# Patient Record
Sex: Male | Born: 1955 | Race: White | Hispanic: No | Marital: Single | Smoking: Never smoker
Health system: Southern US, Community
[De-identification: ages and names within clinical notes are randomized; demographics above are authoritative.]

## PROBLEM LIST (undated history)

## (undated) DIAGNOSIS — I429 Cardiomyopathy, unspecified: Secondary | ICD-10-CM

## (undated) DIAGNOSIS — I10 Essential (primary) hypertension: Secondary | ICD-10-CM

## (undated) DIAGNOSIS — M1990 Unspecified osteoarthritis, unspecified site: Secondary | ICD-10-CM

## (undated) DIAGNOSIS — E785 Hyperlipidemia, unspecified: Secondary | ICD-10-CM

## (undated) DIAGNOSIS — G4730 Sleep apnea, unspecified: Secondary | ICD-10-CM

## (undated) DIAGNOSIS — IMO0001 Reserved for inherently not codable concepts without codable children: Secondary | ICD-10-CM

## (undated) DIAGNOSIS — I38 Endocarditis, valve unspecified: Secondary | ICD-10-CM

## (undated) HISTORY — DX: Cardiomyopathy, unspecified: I42.9

## (undated) HISTORY — PX: OTHER SURGICAL HISTORY: SHX169

## (undated) HISTORY — DX: Hyperlipidemia, unspecified: E78.5

## (undated) HISTORY — DX: Endocarditis, valve unspecified: I38

## (undated) HISTORY — DX: Sleep apnea, unspecified: G47.30

---

## 2012-04-30 ENCOUNTER — Observation Stay: Payer: Self-pay | Admitting: Internal Medicine

## 2012-04-30 LAB — COMPREHENSIVE METABOLIC PANEL
Albumin: 3.8 g/dL (ref 3.4–5.0)
Alkaline Phosphatase: 82 U/L (ref 50–136)
BUN: 18 mg/dL (ref 7–18)
Bilirubin,Total: 0.4 mg/dL (ref 0.2–1.0)
Creatinine: 0.84 mg/dL (ref 0.60–1.30)
Glucose: 96 mg/dL (ref 65–99)
Osmolality: 277 (ref 275–301)
SGOT(AST): 27 U/L (ref 15–37)
Sodium: 138 mmol/L (ref 136–145)
Total Protein: 7.1 g/dL (ref 6.4–8.2)

## 2012-04-30 LAB — CBC
HCT: 42.2 % (ref 40.0–52.0)
MCHC: 34.7 g/dL (ref 32.0–36.0)
MCV: 91 fL (ref 80–100)
RBC: 4.63 10*6/uL (ref 4.40–5.90)
RDW: 13.2 % (ref 11.5–14.5)
WBC: 5.6 10*3/uL (ref 3.8–10.6)

## 2012-04-30 LAB — TROPONIN I: Troponin-I: <0.02 ng/mL

## 2012-04-30 LAB — CK TOTAL AND CKMB (NOT AT ARMC): CK, Total: 147 U/L (ref 35–232)

## 2012-05-01 LAB — LIPID PANEL
Cholesterol: 170 mg/dL (ref 0–200)
HDL Cholesterol: 58 mg/dL (ref 40–60)
Ldl Cholesterol, Calc: 90 mg/dL (ref 0–100)
Triglycerides: 111 mg/dL (ref 0–200)
VLDL Cholesterol, Calc: 22 mg/dL (ref 5–40)

## 2012-05-01 LAB — HEMOGLOBIN A1C: Hemoglobin A1C: 5 % (ref 4.2–6.3)

## 2014-03-12 DIAGNOSIS — E782 Mixed hyperlipidemia: Secondary | ICD-10-CM | POA: Insufficient documentation

## 2014-09-14 NOTE — Discharge Summary (Signed)
PATIENT NAME:  Gavin Barnes, Gavin Barnes MR#:  [id number] DATE OF BIRTH:  16-Jan-1956  DATE OF ADMISSION:  04/30/2012 DATE OF DISCHARGE:  05/01/2012  DISCHARGE DIAGNOSES:  1. Chest pain likely due to acute new onset congestive heart failure, now improved. Negative cardiac enzymes.  2. Lower extremity edema likely from congestive heart failure.  3. Acute systolic congestive heart failure, new onset, with echo showing ejection fraction of 35%, started on ACE inhibitor and low dose diuretic, pt in agreement. He is also in agreement to see Cardiology tomorrow but he did not want to stay in the hospital anymore.    SECONDARY DIAGNOSIS: Hypertension.   CONSULTATIONS: None.   PROCEDURES/RADIOLOGY:  1. Chest x-ray on December 4th showed no acute cardiopulmonary disease.  2. 2-D echocardiogram on December 5th showed mild to moderate global hypokinesis of left ventricle, EF of 35%, mild to moderate mitral regurgitation, mild to moderate tricuspid regurgitation.   HISTORY AND SHORT HOSPITAL COURSE: The patient is a 59 year old male with no significant medical problems who was admitted for chest pain. He was ruled out with three negative sets of cardiac enzymes. His chest pain was resolved although he had significant lower extremity edema for which 2-D echocardiogram was obtained which showed EF of 35% and significant regurgitation. The patient was really anxious to go home and did not want to stay in the hospital as he did not have anymore symptoms other than lower extremity edema. He was felt stable enough to be discharged home. His chest pain was resolved.  On the date of discharge, his vital signs were as follows: Temperature 97.6, heart rate 65 per minute, respirations 18 per minute, blood pressure 121/82 mmHg. He was saturating 97% on room air.   PERTINENT PHYSICAL EXAMINATION ON THE DATE OF DISCHARGE: CARDIOVASCULAR: S1, S2 normal. No murmurs, rubs, or gallop. LUNGS: Clear to auscultation bilaterally. No  wheezing, rales, rhonchi, or crepitation. ABDOMEN: Soft, benign. NEUROLOGIC: Nonfocal examination. LOWER EXTREMITIES: He had 2+ edema bilaterally. All other physical examination remained at the baseline.   DISCHARGE MEDICATIONS:  1. Lisinopril 2.5 mg p.o. daily.  2. Lasix 20 mg p.o. daily.  3. Stress 600 with zinc once daily.  4. Multivitamin once daily.    DISCHARGE DIET: Low sodium, low fat, low cholesterol.   DISCHARGE ACTIVITY: As tolerated.   DISCHARGE INSTRUCTIONS AND FOLLOW-UP:  1. The patient was instructed to follow-up with his new primary care physician, Dr. Benita Stabile, in 1 to 2 weeks.  2. He will need follow-up with Dr. Serafina Royals tomorrow morning on December 6th at 9 a.m. The patient was in agreement with the follow-up appointment.   CONDITION ON DISCHARGE: He is being discharged in stable condition.   TOTAL TIME DISCHARGING THIS PATIENT: 55 minutes.   ____________________________ Lucina Mellow. Manuella Ghazi, MD vss:drc D: 05/01/2012 22:35:18 ET T: 05/02/2012 11:25:42 ET JOB#: [id number]  cc: Zabdiel Dripps S. Manuella Ghazi, MD, <Dictator> Leona Carry. Hall Busing, MD Corey Skains, MD Lucina Mellow [facility] MD ELECTRONICALLY SIGNED 05/02/2012 14:29

## 2014-09-14 NOTE — H&P (Signed)
PATIENT NAME:  Gavin Barnes, Gavin Barnes MR#:  [id number] DATE OF BIRTH:  [birth year]  DATE OF ADMISSION:  04/30/2012  PRIMARY CARE PHYSICIAN: None.   CHIEF COMPLAINT: Chest pain.   HISTORY OF PRESENT ILLNESS: This is a 59 year old male with past medical history of hypertension and noncompliant with the medication and following with doctor as he didn't want to go to the primary care physician and he stopped going to him and is in search of new medical doctor, is not taking any medication for his hypertension currently. 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 He took a break and sat down for a while, had his lunch but the pain did not get relieved so after one hour he decided to go the nurse in the school. She checked the blood pressure, pressure was high at level of 160 so she told him to go to the Emergency Room or call 911 and patient preferred to come to the Emergency Room. He denies any cough, any fever, palpitations, syncopal episode, dizziness or similar pain in the past. He says that after coming to the Emergency Room and receiving some medications the pain is better now. It is 1/10 right now.   REVIEW OF SYSTEMS: GENERAL: Negative for fever, weight loss, weakness, EYES: No blurring of vision. No irritation or inflammation or discharge from the eyes. EARS: No hearing problem, ringing in the ears or discharge from the ears. RESPIRATORY: No shortness of breath, cough. CARDIOVASCULAR: Has chest pain but denies any palpitation, orthopnea, or edema on the limbs. GASTROINTESTINAL: Denies any nausea, vomiting, diarrhea, or change in bowel habits. GENITOURINARY: Denies any increased frequency, burning or change in his urinary habits. MUSCULOSKELETAL: Denies any joint swelling or pain. SKIN: Denies any rashes. NEUROLOGICAL: Denies any headache, numbness, tingling, weakness,  tremors. PSYCHIATRIC: Denies any insomnia, depression or psychiatric problems.   PAST MEDICAL HISTORY: Hypertension, noncompliant.   PAST SURGICAL HISTORY: None.   ALLERGIES: He has frequent allergic reactions, multiple tests are done to find allergen but they could not find any allergen. Denies any allergy to medication.   SOCIAL HISTORY:  Negative for smoking, drinking alcohol socially, and denies use of illegal drugs. He is a Education officer, museum in seventh grade.   FAMILY HISTORY: Positive for sudden cardiac death due to heart attack in brother at age of 61.    PHYSICAL EXAMINATION:  VITAL SIGNS: Temperature 97.3, pulse 93, blood pressure 182/92, oxygen saturation 99 on room air, respirations 18.   GENERAL: He does not appear in any acute distress, alert, oriented to time, place, and person and fully cooperative with history taking and physical examination.   HEENT: Head and neck grossly atraumatic. Conjunctivae pink. Oral mucosa moist. Hearing intact.   NECK: Supple. No JVD. No cervical lymphadenopathy appreciated.   RESPIRATORY: Bilateral clear and equal air entry.   CARDIOVASCULAR: S1, S2 present, regular. No murmur.   ABDOMEN: Soft, nontender. Bowel sounds present. No organomegaly appreciated.   SKIN: No rashes.   LYMPH: Bilateral leg pitting edema present.   NEUROLOGICAL: Grossly intact. No tremor. No sensory or motor deficit.   PSYCHIATRIC: Does not appear in any acute psychiatric illness.   LABORATORY, DIAGNOSTIC AND RADIOLOGICAL DATA: Glucose 96, BUN 18, creatinine 0.84, sodium 138, potassium 3.9, chloride 108, CO2 24, calcium 8.5, total protein 7.1, albumin 3.8, bilirubin 0.4, alkaline phosphatase 82, SGOT 27,  SGPT 32, CK total 147, troponin less than 0.02, WBC 5.6, hemoglobin 14.6, platelet count 253. Chest x-ray is not done yet. EKG left ventricular hypertrophy is evident by AVL, otherwise normal sinus rhythm.   ASSESSMENT: 59 year old male noncompliant with the  medication for hypertension and EKG finding of left ventricular hypertrophy having family history of cardiac death presented with chest pain radiating to left arm.  1. Unstable angina. Will keep him under observation on telemetry floor. Do serial troponin. Aspirin, metoprolol for blood pressure control and echocardiogram to check LV function.  2. Hypertension. Will give him metoprolol. 3. Deep vein thrombosis and GI prophylaxis.  4. CODE STATUS: FULL CODE.   TOTAL TIME SPENT: 55 minutes.   ____________________________ Ceasar Lund Anselm Jungling, MD vgv:cms D: 04/30/2012 21:02:44 ET T: 05/01/2012 06:17:52 ET JOB#: [id number]  cc: Ceasar Lund. Anselm Jungling, MD, <Dictator>  Vaughan Basta MD ELECTRONICALLY SIGNED 05/12/2012 22:55

## 2014-09-15 DIAGNOSIS — I10 Essential (primary) hypertension: Secondary | ICD-10-CM | POA: Insufficient documentation

## 2015-01-10 ENCOUNTER — Other Ambulatory Visit: Payer: Self-pay | Admitting: Otolaryngology

## 2015-01-10 ENCOUNTER — Encounter: Payer: Self-pay | Admitting: *Deleted

## 2015-01-10 DIAGNOSIS — K148 Other diseases of tongue: Secondary | ICD-10-CM

## 2015-01-10 DIAGNOSIS — R220 Localized swelling, mass and lump, head: Principal | ICD-10-CM

## 2015-01-12 ENCOUNTER — Ambulatory Visit
Admission: RE | Admit: 2015-01-12 | Discharge: 2015-01-12 | Disposition: A | Discharge status: Home / Self Care | Payer: BC Managed Care – PPO | Source: Ambulatory Visit | Attending: Otolaryngology | Admitting: Otolaryngology

## 2015-01-12 DIAGNOSIS — K148 Other diseases of tongue: Secondary | ICD-10-CM

## 2015-01-12 DIAGNOSIS — R220 Localized swelling, mass and lump, head: Secondary | ICD-10-CM | POA: Diagnosis present

## 2015-01-12 DIAGNOSIS — K149 Disease of tongue, unspecified: Secondary | ICD-10-CM | POA: Insufficient documentation

## 2015-01-12 DIAGNOSIS — M47892 Other spondylosis, cervical region: Secondary | ICD-10-CM | POA: Diagnosis not present

## 2015-01-12 DIAGNOSIS — R599 Enlarged lymph nodes, unspecified: Secondary | ICD-10-CM | POA: Diagnosis not present

## 2015-01-12 MED ORDER — IOHEXOL 300 MG/ML  SOLN
75.0000 mL | Freq: Once | INTRAMUSCULAR | Status: AC | PRN
  Administered 2015-01-12: 75 mL via INTRAVENOUS

## 2015-01-12 NOTE — Discharge Instructions (Signed)

## 2015-01-13 ENCOUNTER — Ambulatory Visit: Payer: BC Managed Care – PPO | Admitting: Anesthesiology

## 2015-01-13 ENCOUNTER — Ambulatory Visit
Admission: RE | Admit: 2015-01-13 | Discharge: 2015-01-13 | Disposition: A | Discharge status: Home / Self Care | Payer: BC Managed Care – PPO | Source: Ambulatory Visit | Attending: Otolaryngology | Admitting: Otolaryngology

## 2015-01-13 ENCOUNTER — Encounter: Payer: Self-pay | Admitting: *Deleted

## 2015-01-13 ENCOUNTER — Encounter
Admission: RE | Disposition: A | Discharge status: Home / Self Care | Payer: Self-pay | Source: Ambulatory Visit | Attending: Otolaryngology

## 2015-01-13 DIAGNOSIS — C029 Malignant neoplasm of tongue, unspecified: Secondary | ICD-10-CM | POA: Diagnosis not present

## 2015-01-13 DIAGNOSIS — Z79891 Long term (current) use of opiate analgesic: Secondary | ICD-10-CM | POA: Diagnosis not present

## 2015-01-13 DIAGNOSIS — Z823 Family history of stroke: Secondary | ICD-10-CM | POA: Insufficient documentation

## 2015-01-13 DIAGNOSIS — I10 Essential (primary) hypertension: Secondary | ICD-10-CM | POA: Diagnosis not present

## 2015-01-13 DIAGNOSIS — I2510 Atherosclerotic heart disease of native coronary artery without angina pectoris: Secondary | ICD-10-CM | POA: Diagnosis not present

## 2015-01-13 DIAGNOSIS — Z79899 Other long term (current) drug therapy: Secondary | ICD-10-CM | POA: Insufficient documentation

## 2015-01-13 DIAGNOSIS — Z8262 Family history of osteoporosis: Secondary | ICD-10-CM | POA: Insufficient documentation

## 2015-01-13 DIAGNOSIS — K1370 Unspecified lesions of oral mucosa: Secondary | ICD-10-CM | POA: Diagnosis present

## 2015-01-13 HISTORY — DX: Essential (primary) hypertension: I10

## 2015-01-13 HISTORY — PX: TONGUE BIOPSY: SHX6575

## 2015-01-13 HISTORY — PX: DIRECT LARYNGOSCOPY: SHX5326

## 2015-01-13 SURGERY — LARYNGOSCOPY, DIRECT
Anesthesia: General | Laterality: Right | Wound class: Clean Contaminated

## 2015-01-13 MED ORDER — LIDOCAINE HCL 4 % MT SOLN
OROMUCOSAL | Status: DC | PRN
  Administered 2015-01-13: 4 mL via TOPICAL

## 2015-01-13 MED ORDER — OXYCODONE HCL 5 MG/5ML PO SOLN
5.0000 mg | Freq: Once | ORAL | Status: DC | PRN
Start: 2015-01-13 — End: 2015-01-13

## 2015-01-13 MED ORDER — LIDOCAINE HCL (CARDIAC) 20 MG/ML IV SOLN
INTRAVENOUS | Status: DC | PRN
  Administered 2015-01-13: 50 mg via INTRAVENOUS

## 2015-01-13 MED ORDER — MIDAZOLAM HCL 5 MG/5ML IJ SOLN
INTRAMUSCULAR | Status: DC | PRN
  Administered 2015-01-13: 2 mg via INTRAVENOUS

## 2015-01-13 MED ORDER — ACETAMINOPHEN 325 MG PO TABS: 325.0000 mg | ORAL_TABLET | ORAL | Status: DC | PRN

## 2015-01-13 MED ORDER — ONDANSETRON HCL 4 MG/2ML IJ SOLN
INTRAMUSCULAR | Status: DC | PRN
  Administered 2015-01-13: 4 mg via INTRAVENOUS

## 2015-01-13 MED ORDER — PROPOFOL 10 MG/ML IV BOLUS
INTRAVENOUS | Status: DC | PRN
  Administered 2015-01-13: 150 mg via INTRAVENOUS
  Administered 2015-01-13: 40 mg via INTRAVENOUS

## 2015-01-13 MED ORDER — ACETAMINOPHEN 160 MG/5ML PO SOLN: 325.0000 mg | ORAL | Status: DC | PRN

## 2015-01-13 MED ORDER — SUCCINYLCHOLINE CHLORIDE 20 MG/ML IJ SOLN
INTRAMUSCULAR | Status: DC | PRN
  Administered 2015-01-13: 80 mg via INTRAVENOUS

## 2015-01-13 MED ORDER — SILVER NITRATE-POT NITRATE 75-25 % EX MISC
CUTANEOUS | Status: DC | PRN
  Administered 2015-01-13: 1

## 2015-01-13 MED ORDER — OXYCODONE HCL 5 MG PO TABS: 5.0000 mg | ORAL_TABLET | Freq: Once | ORAL | Status: DC | PRN

## 2015-01-13 MED ORDER — DEXAMETHASONE SODIUM PHOSPHATE 4 MG/ML IJ SOLN
INTRAMUSCULAR | Status: DC | PRN
  Administered 2015-01-13: 10 mg via INTRAVENOUS

## 2015-01-13 MED ORDER — FENTANYL CITRATE (PF) 100 MCG/2ML IJ SOLN
INTRAMUSCULAR | Status: DC | PRN
  Administered 2015-01-13 (×2): 25 ug via INTRAVENOUS
  Administered 2015-01-13: 50 ug via INTRAVENOUS

## 2015-01-13 MED ORDER — LACTATED RINGERS IV SOLN
INTRAVENOUS | Status: DC
  Administered 2015-01-13: 08:00:00 via INTRAVENOUS

## 2015-01-13 MED ORDER — ONDANSETRON HCL 4 MG/2ML IJ SOLN: 4.0000 mg | Freq: Once | INTRAMUSCULAR | Status: DC | PRN

## 2015-01-13 MED ORDER — GLYCOPYRROLATE 0.2 MG/ML IJ SOLN
INTRAMUSCULAR | Status: DC | PRN
  Administered 2015-01-13: .1 mg via INTRAVENOUS

## 2015-01-13 MED ORDER — FENTANYL CITRATE (PF) 100 MCG/2ML IJ SOLN
25.0000 ug | INTRAMUSCULAR | Status: DC | PRN
  Administered 2015-01-13 (×3): 25 ug via INTRAVENOUS

## 2015-01-13 SURGICAL SUPPLY — 40 items
BASIN GRAD PLASTIC 32OZ STRL (MISCELLANEOUS) IMPLANT
BLADE SURG 15 STRL LF DISP TIS (BLADE) IMPLANT
BLADE SURG 15 STRL SS (BLADE)
BLOCK BITE GUARD (MISCELLANEOUS) ×3 IMPLANT
CORD BIP STRL DISP 12FT (MISCELLANEOUS) IMPLANT
COVER MAYO STAND STRL (DRAPES) ×3 IMPLANT
COVER TABLE BACK 60X90 (DRAPES) ×3 IMPLANT
CUP MEDICINE 2OZ PLAST GRAD ST (MISCELLANEOUS) IMPLANT
DRAPE HEAD BAR (DRAPES) ×3 IMPLANT
DRAPE SHEET LG 3/4 BI-LAMINATE (DRAPES) ×3 IMPLANT
DRESSING TELFA 4X3 1S ST N-ADH (GAUZE/BANDAGES/DRESSINGS) ×3 IMPLANT
ELECT CAUTERY BLADE TIP 2.5 (TIP)
ELECT CAUTERY NEEDLE 2.0 MIC (NEEDLE) ×3 IMPLANT
ELECTRODE CAUTERY BLDE TIP 2.5 (TIP) IMPLANT
GAUZE SPONGE 4X4 12PLY STRL (GAUZE/BANDAGES/DRESSINGS) IMPLANT
GLOVE PI ULTRA LF STRL 7.5 (GLOVE) ×2 IMPLANT
GLOVE PI ULTRA NON LATEX 7.5 (GLOVE) ×4
LIQUID BAND (GAUZE/BANDAGES/DRESSINGS) IMPLANT
MARKER SKIN SURG W/RULER VIO (MISCELLANEOUS) IMPLANT
NEEDLE 18GX1X1/2 (RX/OR ONLY) (NEEDLE) IMPLANT
NEEDLE FILTER BLUNT 18X 1/2SAF (NEEDLE)
NEEDLE FILTER BLUNT 18X1 1/2 (NEEDLE) IMPLANT
NEEDLE HYPO 25GX1X1/2 BEV (NEEDLE) ×3 IMPLANT
NS IRRIG 500ML POUR BTL (IV SOLUTION) ×3 IMPLANT
PACK DRAPE NASAL/ENT (PACKS) ×3 IMPLANT
PAD GROUND ADULT SPLIT (MISCELLANEOUS) ×3 IMPLANT
PATTIES SURGICAL .5 X.5 (GAUZE/BANDAGES/DRESSINGS) ×3 IMPLANT
PENCIL ELECTRO HAND CTR (MISCELLANEOUS) ×3 IMPLANT
SOL PREP PVP 2OZ (MISCELLANEOUS) ×3
SOLUTION PREP PVP 2OZ (MISCELLANEOUS) ×1 IMPLANT
SPONGE XRAY 4X4 16PLY STRL (MISCELLANEOUS) ×3 IMPLANT
STRAP BODY AND KNEE 60X3 (MISCELLANEOUS) ×3 IMPLANT
SUCTION FRAZIER TIP 10 FR DISP (SUCTIONS) IMPLANT
SUT PROLENE 5 0 P 3 (SUTURE) ×3 IMPLANT
SUT VIC AB 4-0 RB1 27 (SUTURE) ×2
SUT VIC AB 4-0 RB1 27X BRD (SUTURE) ×1 IMPLANT
SYRINGE 10CC LL (SYRINGE) ×3 IMPLANT
TOWEL OR 17X26 4PK STRL BLUE (TOWEL DISPOSABLE) ×3 IMPLANT
TUBING CONN 6MMX3.1M (TUBING) ×2
TUBING SUCTION CONN 0.25 STRL (TUBING) ×1 IMPLANT

## 2015-01-13 NOTE — Anesthesia Procedure Notes (Signed)
Procedure Name: Intubation Date/Time: 01/13/2015 8:54 AM Performed by: Mayme Genta Pre-anesthesia Checklist: Patient identified, Emergency Drugs available, Suction available, Patient being monitored and Timeout performed Patient Re-evaluated:Patient Re-evaluated prior to inductionOxygen Delivery Method: Circle system utilized Preoxygenation: Pre-oxygenation with 100% oxygen Intubation Type: IV induction Ventilation: Mask ventilation without difficulty Laryngoscope Size: Glidescope and 4 Grade View: Grade III Tube type: MLT Tube size: 6.0 mm Number of attempts: 3 Placement Confirmation: ETT inserted through vocal cords under direct vision,  positive ETCO2 and breath sounds checked- equal and bilateral Tube secured with: Tape Dental Injury: Teeth and Oropharynx as per pre-operative assessment  Comments: DL x 1 by Barbra Sarks CRNA, unable to visiualize cords. DL x 1 by Dr Junious Dresser, unable to visualize cords. Glidescope x 1 with full view of cords. ETT passed with stylet. +/= BBS.

## 2015-01-13 NOTE — Anesthesia Postprocedure Evaluation (Signed)
  Anesthesia Post-op Note  Patient: Gavin Barnes  Procedure(s) Performed: Procedure(s): DIRECT LARYNGOSCOPY (Right) TONGUE BIOPSY LESION RIGHT (Right)  Anesthesia type:General ETT  Patient location: PACU  Post pain: Pain level controlled  Post assessment: Post-op Vital signs reviewed, Patient's Cardiovascular Status Stable, Respiratory Function Stable, Patent Airway and No signs of Nausea or vomiting  Post vital signs: Reviewed and stable  Last Vitals:  Filed Vitals:   01/13/15 1000  BP: 138/88  Pulse: 92  Temp:   Resp: 12    Level of consciousness: awake, alert  and patient cooperative  Complications: No apparent anesthesia complications

## 2015-01-13 NOTE — Op Note (Signed)
01/13/2015  9:20 AM    Gavin Barnes  [id number]   Pre-Op Dx:  Right tongue lesion with suspicion of cancer   Post-op Dx: Right tongue lesion, likely cancer.  Proc: Direct laryngoscopy, biopsy right tongue lesion   Surg:  Zia Najera H  Anes:  GOT  EBL:  5 mL  Comp:  None  Findings:  Ulcerative lesion right lateral tongue, induration and fullness extends to a total of 5 cm and across midline  Procedure: The patient was given general anesthesia by oral endotracheal intubation. He had an anterior larynx and was difficult to see, and a glide scope was used for visualizing the cords. There were no lesions noted on the cords and he was intubated using a #6 oral endotracheal tube. A Dedo laryngoscope was used for visualizing the hypopharynx and larynx. The tongue base was clear and the vallecula was clear as well. The epiglottis looked normal. The vocal cords were without lesions. The piriform sinuses were clear. The posterior pharynx and lateral pharyngeal walls were clear.  The right side of the tongue had lost of lesion that was about 2.5 cm in length but had firmness and induration extending at least a centimeter all the way around the outside of the lesion. That induration can be felt a centimeter across midline to make the lesion at least 2 cm deep, and the induration could be felt 2 cm behind the mass as well. Duration extended down the floor mouth towards the submandibular triangle. The induration extended at least 5 cm from anterior to posterior over 2 cm in depth across midline and into the floor mouth. The mass was much larger than what could be observed at the ulcerative area.  A large cup biting forcep was used to remove multiple pieces of lesion to send for permanent section. These were taken from the anterior and inferior border and some from deep in the wound. He was oozing from the biopsy sites which was controlled with silver nitrate cautery.  Patient tolerated procedure  well.  he was awakened taken to the recovery room in satisfactory condition. There were no operative complications.  Dispo:   To PACU and then to be discharged home  Plan:  To follow-up in the office on Tuesday afternoon to go over the path report and discuss a treatment plan  Gavin Barnes H  01/13/2015 9:20 AM

## 2015-01-13 NOTE — H&P (Signed)
  H&P has been reviewed and no changes necessary. To be downloaded later. 

## 2015-01-13 NOTE — Transfer of Care (Signed)
Immediate Anesthesia Transfer of Care Note  Patient: Gavin Barnes  Procedure(s) Performed: Procedure(s): DIRECT LARYNGOSCOPY (Right) TONGUE BIOPSY LESION RIGHT (Right)  Patient Location: PACU  Anesthesia Type: General ETT  Level of Consciousness: awake, alert  and patient cooperative  Airway and Oxygen Therapy: Patient Spontanous Breathing and Patient connected to supplemental oxygen  Post-op Assessment: Post-op Vital signs reviewed, Patient's Cardiovascular Status Stable, Respiratory Function Stable, Patent Airway and No signs of Nausea or vomiting  Post-op Vital Signs: Reviewed and stable  Complications: No apparent anesthesia complications

## 2015-01-13 NOTE — Anesthesia Preprocedure Evaluation (Signed)
Anesthesia Evaluation  Patient identified by MRN, date of birth, ID band  Reviewed: Allergy & Precautions, H&P , NPO status , Patient's Chart, lab work & pertinent test results  Airway Mallampati: III  TM Distance: >3 FB Neck ROM: full    Dental no notable dental hx.    Pulmonary    Pulmonary exam normal       Cardiovascular hypertension, Rhythm:regular Rate:Normal     Neuro/Psych    GI/Hepatic   Endo/Other    Renal/GU      Musculoskeletal   Abdominal   Peds  Hematology   Anesthesia Other Findings Large soft mass on right side of tongue.  Does not appear that it will cause difficulty with intubation.  Reproductive/Obstetrics                             Anesthesia Physical Anesthesia Plan  ASA: II  Anesthesia Plan: General ETT   Post-op Pain Management:    Induction:   Airway Management Planned:   Additional Equipment:   Intra-op Plan:   Post-operative Plan:   Informed Consent: I have reviewed the patients History and Physical, chart, labs and discussed the procedure including the risks, benefits and alternatives for the proposed anesthesia with the patient or authorized representative who has indicated his/her understanding and acceptance.     Plan Discussed with: CRNA  Anesthesia Plan Comments:         Anesthesia Quick Evaluation

## 2015-01-14 ENCOUNTER — Encounter: Payer: Self-pay | Admitting: Otolaryngology

## 2015-01-17 LAB — SURGICAL PATHOLOGY

## 2015-01-21 ENCOUNTER — Ambulatory Visit: Payer: BC Managed Care – PPO | Admitting: Oncology

## 2015-01-26 ENCOUNTER — Inpatient Hospital Stay: Payer: BC Managed Care – PPO | Attending: Oncology | Admitting: Oncology

## 2015-01-26 ENCOUNTER — Encounter: Payer: Self-pay | Admitting: Oncology

## 2015-01-26 VITALS — BP 118/80 | HR 86 | Temp 98.6°F | Resp 16 | Ht 65.55 in | Wt 171.5 lb

## 2015-01-26 DIAGNOSIS — E785 Hyperlipidemia, unspecified: Secondary | ICD-10-CM | POA: Diagnosis not present

## 2015-01-26 DIAGNOSIS — Z800 Family history of malignant neoplasm of digestive organs: Secondary | ICD-10-CM

## 2015-01-26 DIAGNOSIS — Z79899 Other long term (current) drug therapy: Secondary | ICD-10-CM | POA: Diagnosis not present

## 2015-01-26 DIAGNOSIS — C029 Malignant neoplasm of tongue, unspecified: Secondary | ICD-10-CM | POA: Diagnosis present

## 2015-01-26 DIAGNOSIS — I10 Essential (primary) hypertension: Secondary | ICD-10-CM | POA: Insufficient documentation

## 2015-01-30 DIAGNOSIS — C029 Malignant neoplasm of tongue, unspecified: Secondary | ICD-10-CM | POA: Insufficient documentation
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
## 2015-02-03 ENCOUNTER — Ambulatory Visit
Admission: RE | Admit: 2015-02-03 | Discharge: 2015-02-03 | Disposition: A | Discharge status: Home / Self Care | Payer: BC Managed Care – PPO | Source: Ambulatory Visit | Attending: Oncology | Admitting: Oncology

## 2015-02-03 DIAGNOSIS — C029 Malignant neoplasm of tongue, unspecified: Secondary | ICD-10-CM | POA: Diagnosis present

## 2015-02-03 DIAGNOSIS — I2510 Atherosclerotic heart disease of native coronary artery without angina pectoris: Secondary | ICD-10-CM | POA: Insufficient documentation

## 2015-02-03 LAB — GLUCOSE, CAPILLARY: Glucose-Capillary: 90 mg/dL (ref 65–99)

## 2015-02-03 MED ORDER — FLUDEOXYGLUCOSE F - 18 (FDG) INJECTION
13.0800 | Freq: Once | INTRAVENOUS | Status: DC | PRN
  Administered 2015-02-03: 13.08 via INTRAVENOUS
  Filled 2015-02-03: qty 13.08

## 2015-02-07 ENCOUNTER — Inpatient Hospital Stay: Payer: BC Managed Care – PPO | Attending: Oncology | Admitting: Oncology

## 2015-02-07 ENCOUNTER — Ambulatory Visit
Admission: RE | Admit: 2015-02-07 | Discharge: 2015-02-07 | Disposition: A | Discharge status: Home / Self Care | Payer: Self-pay | Source: Ambulatory Visit | Attending: Radiation Oncology | Admitting: Radiation Oncology

## 2015-02-07 ENCOUNTER — Encounter: Payer: Self-pay | Admitting: Radiation Oncology

## 2015-02-07 VITALS — BP 117/79 | HR 87 | Temp 98.4°F | Resp 18 | Wt 175.0 lb

## 2015-02-07 DIAGNOSIS — Z79899 Other long term (current) drug therapy: Secondary | ICD-10-CM | POA: Insufficient documentation

## 2015-02-07 DIAGNOSIS — C01 Malignant neoplasm of base of tongue: Secondary | ICD-10-CM

## 2015-02-07 DIAGNOSIS — Z510 Encounter for antineoplastic radiation therapy: Secondary | ICD-10-CM | POA: Insufficient documentation

## 2015-02-07 DIAGNOSIS — C029 Malignant neoplasm of tongue, unspecified: Secondary | ICD-10-CM | POA: Diagnosis not present

## 2015-02-07 DIAGNOSIS — E785 Hyperlipidemia, unspecified: Secondary | ICD-10-CM | POA: Insufficient documentation

## 2015-02-07 DIAGNOSIS — I38 Endocarditis, valve unspecified: Secondary | ICD-10-CM | POA: Insufficient documentation

## 2015-02-07 DIAGNOSIS — G893 Neoplasm related pain (acute) (chronic): Secondary | ICD-10-CM | POA: Diagnosis not present

## 2015-02-07 DIAGNOSIS — Z800 Family history of malignant neoplasm of digestive organs: Secondary | ICD-10-CM | POA: Insufficient documentation

## 2015-02-07 DIAGNOSIS — I429 Cardiomyopathy, unspecified: Secondary | ICD-10-CM | POA: Insufficient documentation

## 2015-02-07 DIAGNOSIS — C021 Malignant neoplasm of border of tongue: Secondary | ICD-10-CM | POA: Insufficient documentation

## 2015-02-07 DIAGNOSIS — I10 Essential (primary) hypertension: Secondary | ICD-10-CM | POA: Diagnosis not present

## 2015-02-07 DIAGNOSIS — C770 Secondary and unspecified malignant neoplasm of lymph nodes of head, face and neck: Secondary | ICD-10-CM | POA: Insufficient documentation

## 2015-02-07 DIAGNOSIS — G4730 Sleep apnea, unspecified: Secondary | ICD-10-CM | POA: Insufficient documentation
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
## 2015-02-09 ENCOUNTER — Ambulatory Visit: Payer: Self-pay

## 2015-02-09 ENCOUNTER — Telehealth: Payer: Self-pay | Admitting: *Deleted

## 2015-02-09 ENCOUNTER — Ambulatory Visit
Admission: RE | Admit: 2015-02-09 | Discharge: 2015-02-09 | Disposition: A | Discharge status: Home / Self Care | Payer: Self-pay | Source: Ambulatory Visit | Attending: Radiation Oncology | Admitting: Radiation Oncology

## 2015-02-09 NOTE — Telephone Encounter (Signed)
Notified patient of Simulation appointment cancellation here at the [facility], but discussed with him going to Dr. Silvano Rusk office for the dental work.  Also told patient that this office would be calling him with another appointment scheduled for a post extraction follow up and a new simulation appointment.  Patient  Verbalized understanding, and stated that he would go to the dentist office at 2pm today.
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
## 2015-02-15 ENCOUNTER — Telehealth: Payer: Self-pay

## 2015-02-15 NOTE — Telephone Encounter (Signed)
Contacted patient to inform him of upcoming appts for XRT and chemotherapy.  When I told him the lab/MD/treatment was going to be in the mornings he became frustrated and said he was told he could come in the afternoon for treatments that would last 15-20 minutes.  I explained he was referring to the XRT but chemo treatments are longer and need to be schedule earlier in the day he said that will not work for him with his schedule.  When I asked what day would be better he said Saturday.  He then told me he can't talk right now because he is at work and his assistant is beside him.  I asked him to call the office back when he could so we could try to schedule his treatments.

## 2015-02-15 NOTE — Telephone Encounter (Signed)
Ok, thank you.  Please let me know if he is willing to take chemo at all.

## 2015-02-17 ENCOUNTER — Other Ambulatory Visit: Payer: BC Managed Care – PPO

## 2015-02-17 ENCOUNTER — Ambulatory Visit: Payer: BC Managed Care – PPO

## 2015-02-17 ENCOUNTER — Ambulatory Visit: Payer: BC Managed Care – PPO | Admitting: Oncology

## 2015-02-21 ENCOUNTER — Ambulatory Visit: Payer: Self-pay

## 2015-02-22 ENCOUNTER — Ambulatory Visit: Payer: Self-pay

## 2015-02-23 ENCOUNTER — Ambulatory Visit: Payer: Self-pay

## 2015-02-23 NOTE — Patient Instructions (Signed)
Cetuximab injection What is this medicine? CETUXIMAB (se TUX i mab) is a chemotherapy drug. It targets a specific protein within cancer cells and stops the cells from growing. It is used to treat colorectal cancer and head and neck cancer. This medicine may be used for other purposes; ask your health care provider or pharmacist if you have questions. COMMON BRAND NAME(S): Erbitux What should I tell my health care provider before I take this medicine? They need to know if you have any of these conditions: -heart disease -history of irregular heartbeat -history of low levels of calcium, magnesium, or potassium in the blood -lung or breathing disease, like asthma -an unusual or allergic reaction to cetuximab, other medicines, foods, dyes, or preservatives -pregnant or trying to get pregnant -breast-feeding How should I use this medicine? This drug is given as an infusion into a vein. It is administered in a hospital or clinic by a specially trained health care professional. Talk to your pediatrician regarding the use of this medicine in children. Special care may be needed. Overdosage: If you think you have taken too much of this medicine contact a poison control center or emergency room at once. NOTE: This medicine is only for you. Do not share this medicine with others. What if I miss a dose? It is important not to miss your dose. Call your doctor or health care professional if you are unable to keep an appointment. What may interact with this medicine? Interactions are not expected. This list may not describe all possible interactions. Give your health care provider a list of all the medicines, herbs, non-prescription drugs, or dietary supplements you use. Also tell them if you smoke, drink alcohol, or use illegal drugs. Some items may interact with your medicine. What should I watch for while using this medicine? Visit your doctor or health care professional for regular checks on your  progress. This drug may make you feel generally unwell. This is not uncommon, as chemotherapy can affect healthy cells as well as cancer cells. Report any side effects. Continue your course of treatment even though you feel ill unless your doctor tells you to stop. This medicine can make you more sensitive to the sun. Keep out of the sun while taking this medicine and for 2 months after the last dose. If you cannot avoid being in the sun, wear protective clothing and use sunscreen. Do not use sun lamps or tanning beds/booths. You may need blood work done while you are taking this medicine. In some cases, you may be given additional medicines to help with side effects. Follow all directions for their use. Call your doctor or health care professional for advice if you get a fever, chills or sore throat, or other symptoms of a cold or flu. Do not treat yourself. This drug decreases your body's ability to fight infections. Try to avoid being around people who are sick. Avoid taking products that contain aspirin, acetaminophen, ibuprofen, naproxen, or ketoprofen unless instructed by your doctor. These medicines may hide a fever. Do not become pregnant while taking this medicine. Women should inform their doctor if they wish to become pregnant or think they might be pregnant. There is a potential for serious side effects to an unborn child. Use adequate birth control methods. Avoid pregnancy for at least 6 months after your last dose. Talk to your health care professional or pharmacist for more information. Do not breast-feed an infant while taking this medicine or during the 2 months after your last   dose. What side effects may I notice from receiving this medicine? Side effects that you should report to your doctor or health care professional as soon as possible: -allergic reactions like skin rash, itching or hives, swelling of the face, lips, or tongue -breathing problems -changes in vision -fast, irregular  heartbeat -feeling faint or lightheaded, falls -fever, chills -mouth sores -redness, blistering, peeling or loosening of the skin, including inside the mouth -trouble passing urine or change in the amount of urine -unusually weak or tired Side effects that usually do not require medical attention (report to your doctor or health care professional if they continue or are bothersome): -changes in skin like acne, cracks, skin dryness -constipation -diarrhea -headache -nail changes -nausea, vomiting -stomach upset -weight loss This list may not describe all possible side effects. Call your doctor for medical advice about side effects. You may report side effects to FDA at 1-800-FDA-1088. Where should I keep my medicine? This drug is given in a hospital or clinic and will not be stored at home. NOTE: This sheet is a summary. It may not cover all possible information. If you have questions about this medicine, talk to your doctor, pharmacist, or health care provider.  2015, Elsevier/Gold Standard. (2013-08-26 16:14:34)  

## 2015-02-24 ENCOUNTER — Inpatient Hospital Stay: Payer: BC Managed Care – PPO

## 2015-02-24 ENCOUNTER — Ambulatory Visit: Payer: BC Managed Care – PPO | Admitting: Oncology

## 2015-02-24 ENCOUNTER — Ambulatory Visit: Payer: BC Managed Care – PPO

## 2015-02-24 ENCOUNTER — Ambulatory Visit: Payer: Self-pay

## 2015-02-24 ENCOUNTER — Inpatient Hospital Stay: Payer: BC Managed Care – PPO | Admitting: Oncology

## 2015-02-24 ENCOUNTER — Other Ambulatory Visit: Payer: BC Managed Care – PPO

## 2015-02-25 ENCOUNTER — Ambulatory Visit: Payer: Self-pay

## 2015-02-28 ENCOUNTER — Inpatient Hospital Stay: Payer: Self-pay

## 2015-02-28 ENCOUNTER — Ambulatory Visit: Payer: Self-pay

## 2015-02-28 ENCOUNTER — Inpatient Hospital Stay (HOSPITAL_BASED_OUTPATIENT_CLINIC_OR_DEPARTMENT_OTHER): Payer: BC Managed Care – PPO | Admitting: Oncology

## 2015-02-28 ENCOUNTER — Inpatient Hospital Stay: Payer: Self-pay | Attending: Oncology

## 2015-02-28 VITALS — BP 121/80 | HR 80 | Resp 18

## 2015-02-28 VITALS — BP 133/84 | HR 84 | Temp 97.0°F | Wt 165.0 lb

## 2015-02-28 DIAGNOSIS — C029 Malignant neoplasm of tongue, unspecified: Secondary | ICD-10-CM

## 2015-02-28 DIAGNOSIS — E785 Hyperlipidemia, unspecified: Secondary | ICD-10-CM

## 2015-02-28 DIAGNOSIS — I10 Essential (primary) hypertension: Secondary | ICD-10-CM

## 2015-02-28 DIAGNOSIS — F418 Other specified anxiety disorders: Secondary | ICD-10-CM

## 2015-02-28 DIAGNOSIS — G893 Neoplasm related pain (acute) (chronic): Secondary | ICD-10-CM

## 2015-02-28 DIAGNOSIS — C770 Secondary and unspecified malignant neoplasm of lymph nodes of head, face and neck: Secondary | ICD-10-CM

## 2015-02-28 DIAGNOSIS — L270 Generalized skin eruption due to drugs and medicaments taken internally: Secondary | ICD-10-CM | POA: Insufficient documentation

## 2015-02-28 DIAGNOSIS — G43909 Migraine, unspecified, not intractable, without status migrainosus: Secondary | ICD-10-CM | POA: Insufficient documentation

## 2015-02-28 DIAGNOSIS — Z79899 Other long term (current) drug therapy: Secondary | ICD-10-CM

## 2015-02-28 DIAGNOSIS — G4730 Sleep apnea, unspecified: Secondary | ICD-10-CM | POA: Insufficient documentation

## 2015-02-28 DIAGNOSIS — Z5112 Encounter for antineoplastic immunotherapy: Secondary | ICD-10-CM | POA: Insufficient documentation

## 2015-02-28 DIAGNOSIS — T451X5S Adverse effect of antineoplastic and immunosuppressive drugs, sequela: Secondary | ICD-10-CM | POA: Insufficient documentation

## 2015-02-28 DIAGNOSIS — I429 Cardiomyopathy, unspecified: Secondary | ICD-10-CM | POA: Insufficient documentation

## 2015-02-28 DIAGNOSIS — G4709 Other insomnia: Secondary | ICD-10-CM | POA: Insufficient documentation

## 2015-02-28 LAB — CBC WITH DIFFERENTIAL/PLATELET
BASOS ABS: 0 10*3/uL (ref 0–0.1)
Basophils Relative: 1 %
Eosinophils Absolute: 0 10*3/uL (ref 0–0.7)
Eosinophils Relative: 1 %
HEMATOCRIT: 41.9 % (ref 40.0–52.0)
Hemoglobin: 14.5 g/dL (ref 13.0–18.0)
LYMPHS PCT: 17 %
Lymphs Abs: 1 10*3/uL (ref 1.0–3.6)
MCH: 31.5 pg (ref 26.0–34.0)
MCHC: 34.6 g/dL (ref 32.0–36.0)
MCV: 91.1 fL (ref 80.0–100.0)
MONO ABS: 0.7 10*3/uL (ref 0.2–1.0)
Monocytes Relative: 12 %
NEUTROS ABS: 4 10*3/uL (ref 1.4–6.5)
Neutrophils Relative %: 69 %
Platelets: 284 10*3/uL (ref 150–440)
RBC: 4.6 MIL/uL (ref 4.40–5.90)
RDW: 12.7 % (ref 11.5–14.5)
WBC: 5.7 10*3/uL (ref 3.8–10.6)

## 2015-02-28 LAB — COMPREHENSIVE METABOLIC PANEL
ALT: 18 U/L (ref 17–63)
AST: 19 U/L (ref 15–41)
Albumin: 4.1 g/dL (ref 3.5–5.0)
Alkaline Phosphatase: 55 U/L (ref 38–126)
Anion gap: 5 (ref 5–15)
BILIRUBIN TOTAL: 0.6 mg/dL (ref 0.3–1.2)
BUN: 10 mg/dL (ref 6–20)
CALCIUM: 8.4 mg/dL — AB (ref 8.9–10.3)
CO2: 28 mmol/L (ref 22–32)
CREATININE: 0.91 mg/dL (ref 0.61–1.24)
Chloride: 101 mmol/L (ref 101–111)
GFR calc Af Amer: >60 mL/min (ref >60)
Glucose, Bld: 93 mg/dL (ref 65–99)
Potassium: 4.1 mmol/L (ref 3.5–5.1)
Sodium: 134 mmol/L — ABNORMAL LOW (ref 135–145)
TOTAL PROTEIN: 6.9 g/dL (ref 6.5–8.1)

## 2015-02-28 MED ORDER — SODIUM CHLORIDE 0.9 % IV SOLN
Freq: Once | INTRAVENOUS | Status: AC
  Administered 2015-02-28: 12:00:00 via INTRAVENOUS
  Filled 2015-02-28: qty 1000

## 2015-02-28 MED ORDER — CETUXIMAB CHEMO IV INJECTION 200 MG/100ML
400.0000 mg/m2 | Freq: Once | INTRAVENOUS | Status: AC
  Administered 2015-02-28: 800 mg via INTRAVENOUS
  Filled 2015-02-28: qty 400

## 2015-02-28 MED ORDER — DIPHENHYDRAMINE HCL 50 MG/ML IJ SOLN
25.0000 mg | Freq: Once | INTRAMUSCULAR | Status: AC
  Administered 2015-02-28: 25 mg via INTRAVENOUS
  Filled 2015-02-28: qty 1

## 2015-02-28 MED ORDER — LORAZEPAM 2 MG/ML IJ SOLN
0.5000 mg | Freq: Once | INTRAMUSCULAR | Status: AC
Start: 2015-02-28 — End: 2015-02-28
  Administered 2015-02-28: 0.5 mg via INTRAVENOUS
  Filled 2015-02-28: qty 1

## 2015-03-01 ENCOUNTER — Other Ambulatory Visit: Payer: Self-pay | Admitting: *Deleted

## 2015-03-01 ENCOUNTER — Ambulatory Visit: Payer: Self-pay

## 2015-03-01 ENCOUNTER — Ambulatory Visit
Admission: RE | Admit: 2015-03-01 | Discharge: 2015-03-01 | Disposition: A | Discharge status: Home / Self Care | Payer: Self-pay | Source: Ambulatory Visit | Attending: Radiation Oncology | Admitting: Radiation Oncology

## 2015-03-01 MED ORDER — FLUCONAZOLE 100 MG PO TABS: 100.0000 mg | ORAL_TABLET | Freq: Every day | ORAL | Status: DC

## 2015-03-02 ENCOUNTER — Ambulatory Visit: Payer: Self-pay

## 2015-03-03 ENCOUNTER — Ambulatory Visit: Payer: Self-pay

## 2015-03-04 ENCOUNTER — Ambulatory Visit: Payer: Self-pay

## 2015-03-07 ENCOUNTER — Other Ambulatory Visit: Payer: Self-pay | Admitting: *Deleted

## 2015-03-07 ENCOUNTER — Ambulatory Visit: Payer: Self-pay

## 2015-03-07 ENCOUNTER — Inpatient Hospital Stay (HOSPITAL_BASED_OUTPATIENT_CLINIC_OR_DEPARTMENT_OTHER): Payer: BC Managed Care – PPO | Admitting: Oncology

## 2015-03-07 ENCOUNTER — Inpatient Hospital Stay: Payer: Self-pay

## 2015-03-07 VITALS — BP 118/83 | HR 75 | Temp 97.5°F | Resp 16 | Wt 161.8 lb

## 2015-03-07 DIAGNOSIS — E785 Hyperlipidemia, unspecified: Secondary | ICD-10-CM

## 2015-03-07 DIAGNOSIS — C029 Malignant neoplasm of tongue, unspecified: Secondary | ICD-10-CM

## 2015-03-07 DIAGNOSIS — Z79899 Other long term (current) drug therapy: Secondary | ICD-10-CM

## 2015-03-07 DIAGNOSIS — I429 Cardiomyopathy, unspecified: Secondary | ICD-10-CM

## 2015-03-07 DIAGNOSIS — G893 Neoplasm related pain (acute) (chronic): Secondary | ICD-10-CM

## 2015-03-07 DIAGNOSIS — G4709 Other insomnia: Secondary | ICD-10-CM

## 2015-03-07 DIAGNOSIS — F418 Other specified anxiety disorders: Secondary | ICD-10-CM

## 2015-03-07 DIAGNOSIS — I10 Essential (primary) hypertension: Secondary | ICD-10-CM

## 2015-03-07 DIAGNOSIS — C770 Secondary and unspecified malignant neoplasm of lymph nodes of head, face and neck: Secondary | ICD-10-CM

## 2015-03-07 DIAGNOSIS — G4730 Sleep apnea, unspecified: Secondary | ICD-10-CM

## 2015-03-07 DIAGNOSIS — G43909 Migraine, unspecified, not intractable, without status migrainosus: Secondary | ICD-10-CM

## 2015-03-07 LAB — CBC WITH DIFFERENTIAL/PLATELET
Basophils Absolute: 0 10*3/uL (ref 0–0.1)
Basophils Relative: 1 %
EOS PCT: 3 %
Eosinophils Absolute: 0.1 10*3/uL (ref 0–0.7)
HCT: 41.4 % (ref 40.0–52.0)
Hemoglobin: 14.4 g/dL (ref 13.0–18.0)
LYMPHS ABS: 1.1 10*3/uL (ref 1.0–3.6)
LYMPHS PCT: 24 %
MCH: 31.4 pg (ref 26.0–34.0)
MCHC: 34.8 g/dL (ref 32.0–36.0)
MCV: 90.2 fL (ref 80.0–100.0)
MONO ABS: 0.7 10*3/uL (ref 0.2–1.0)
MONOS PCT: 14 %
Neutro Abs: 2.7 10*3/uL (ref 1.4–6.5)
Neutrophils Relative %: 58 %
PLATELETS: 292 10*3/uL (ref 150–440)
RBC: 4.59 MIL/uL (ref 4.40–5.90)
RDW: 12.6 % (ref 11.5–14.5)
WBC: 4.6 10*3/uL (ref 3.8–10.6)

## 2015-03-07 LAB — COMPREHENSIVE METABOLIC PANEL
ALBUMIN: 3.7 g/dL (ref 3.5–5.0)
ALT: 48 U/L (ref 17–63)
AST: 32 U/L (ref 15–41)
Alkaline Phosphatase: 58 U/L (ref 38–126)
Anion gap: 6 (ref 5–15)
BILIRUBIN TOTAL: 0.3 mg/dL (ref 0.3–1.2)
BUN: 11 mg/dL (ref 6–20)
CHLORIDE: 103 mmol/L (ref 101–111)
CO2: 28 mmol/L (ref 22–32)
Calcium: 9 mg/dL (ref 8.9–10.3)
Creatinine, Ser: 0.83 mg/dL (ref 0.61–1.24)
GFR calc Af Amer: >60 mL/min (ref >60)
GFR calc non Af Amer: >60 mL/min (ref >60)
GLUCOSE: 86 mg/dL (ref 65–99)
POTASSIUM: 3.8 mmol/L (ref 3.5–5.1)
Sodium: 137 mmol/L (ref 135–145)
Total Protein: 6.5 g/dL (ref 6.5–8.1)

## 2015-03-07 LAB — MAGNESIUM: Magnesium: 2.2 mg/dL (ref 1.7–2.4)

## 2015-03-07 MED ORDER — SODIUM CHLORIDE 0.9 % IV SOLN
Freq: Once | INTRAVENOUS | Status: AC
  Administered 2015-03-07: 10:00:00 via INTRAVENOUS
  Filled 2015-03-07: qty 1000

## 2015-03-07 MED ORDER — DIPHENHYDRAMINE HCL 50 MG/ML IJ SOLN
25.0000 mg | Freq: Once | INTRAMUSCULAR | Status: AC
  Administered 2015-03-07: 25 mg via INTRAVENOUS
  Filled 2015-03-07: qty 1

## 2015-03-07 MED ORDER — CETUXIMAB CHEMO IV INJECTION 200 MG/100ML
250.0000 mg/m2 | Freq: Once | INTRAVENOUS | Status: AC
  Administered 2015-03-07: 500 mg via INTRAVENOUS
  Filled 2015-03-07: qty 200

## 2015-03-07 MED ORDER — HYDROCODONE-ACETAMINOPHEN 5-325 MG PO TABS: 1.0000 | ORAL_TABLET | Freq: Four times a day (QID) | ORAL | Status: DC | PRN

## 2015-03-07 NOTE — Progress Notes (Signed)
After his first treatment patient had a migraine that lasted 3 hours.  He has not gotten all of the dental work required for him to start the XRT and during his chemo class he was instructed not have any dental work done.  He has an appointment with dentist this Wednesday and is not sure weather to keep it or cancel.  Also is having trouble sleeping at night.  Since his last visit patient has resigned from his current job to have more time and energy to focus on his medical care.  I have spoken with Elease Etienne, social worker, he has an appointment with Barnabas Lister on 03/08/15 @ 10:00 and the patient confirmed that this time will work with his schedule.
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
## 2015-03-08 ENCOUNTER — Ambulatory Visit: Payer: Self-pay

## 2015-03-08 NOTE — Progress Notes (Unsigned)
PSN met with patient today to discuss his financial concerns.  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  Patient lives alone and currently has no income.  PSN gave patient information about applying for Social Security benefits, and unemployment.  PSN will assist patient with resources from the [employer] [facility] [employer]

## 2015-03-09 ENCOUNTER — Ambulatory Visit: Payer: Self-pay

## 2015-03-10 ENCOUNTER — Ambulatory Visit
Admission: RE | Admit: 2015-03-10 | Discharge: 2015-03-10 | Disposition: A | Discharge status: Home / Self Care | Payer: Self-pay | Source: Ambulatory Visit | Attending: Radiation Oncology | Admitting: Radiation Oncology

## 2015-03-10 ENCOUNTER — Ambulatory Visit: Payer: Self-pay

## 2015-03-11 ENCOUNTER — Ambulatory Visit: Payer: Self-pay

## 2015-03-14 ENCOUNTER — Ambulatory Visit
Admission: RE | Admit: 2015-03-14 | Discharge: 2015-03-14 | Disposition: A | Discharge status: Home / Self Care | Payer: Self-pay | Source: Ambulatory Visit | Attending: Radiation Oncology | Admitting: Radiation Oncology

## 2015-03-14 ENCOUNTER — Ambulatory Visit: Payer: Self-pay

## 2015-03-14 ENCOUNTER — Inpatient Hospital Stay: Payer: Self-pay

## 2015-03-14 ENCOUNTER — Inpatient Hospital Stay (HOSPITAL_BASED_OUTPATIENT_CLINIC_OR_DEPARTMENT_OTHER): Payer: Self-pay | Admitting: Oncology

## 2015-03-14 VITALS — BP 115/73 | HR 65 | Temp 96.1°F | Resp 16 | Wt 167.3 lb

## 2015-03-14 DIAGNOSIS — C029 Malignant neoplasm of tongue, unspecified: Secondary | ICD-10-CM

## 2015-03-14 DIAGNOSIS — T451X5D Adverse effect of antineoplastic and immunosuppressive drugs, subsequent encounter: Secondary | ICD-10-CM

## 2015-03-14 DIAGNOSIS — G4730 Sleep apnea, unspecified: Secondary | ICD-10-CM

## 2015-03-14 DIAGNOSIS — F418 Other specified anxiety disorders: Secondary | ICD-10-CM

## 2015-03-14 DIAGNOSIS — Z79899 Other long term (current) drug therapy: Secondary | ICD-10-CM

## 2015-03-14 DIAGNOSIS — E785 Hyperlipidemia, unspecified: Secondary | ICD-10-CM

## 2015-03-14 DIAGNOSIS — C770 Secondary and unspecified malignant neoplasm of lymph nodes of head, face and neck: Secondary | ICD-10-CM

## 2015-03-14 DIAGNOSIS — I10 Essential (primary) hypertension: Secondary | ICD-10-CM

## 2015-03-14 DIAGNOSIS — L270 Generalized skin eruption due to drugs and medicaments taken internally: Secondary | ICD-10-CM

## 2015-03-14 DIAGNOSIS — I429 Cardiomyopathy, unspecified: Secondary | ICD-10-CM

## 2015-03-14 DIAGNOSIS — G4709 Other insomnia: Secondary | ICD-10-CM

## 2015-03-14 DIAGNOSIS — G893 Neoplasm related pain (acute) (chronic): Secondary | ICD-10-CM

## 2015-03-14 LAB — CBC WITH DIFFERENTIAL/PLATELET
BASOS ABS: 0 10*3/uL (ref 0–0.1)
BASOS PCT: 1 %
Eosinophils Absolute: 0.1 10*3/uL (ref 0–0.7)
Eosinophils Relative: 2 %
HEMATOCRIT: 42.7 % (ref 40.0–52.0)
HEMOGLOBIN: 14.7 g/dL (ref 13.0–18.0)
LYMPHS PCT: 15 %
Lymphs Abs: 0.8 10*3/uL — ABNORMAL LOW (ref 1.0–3.6)
MCH: 31.1 pg (ref 26.0–34.0)
MCHC: 34.5 g/dL (ref 32.0–36.0)
MCV: 90.2 fL (ref 80.0–100.0)
Monocytes Absolute: 0.6 10*3/uL (ref 0.2–1.0)
Monocytes Relative: 13 %
NEUTROS ABS: 3.6 10*3/uL (ref 1.4–6.5)
NEUTROS PCT: 69 %
Platelets: 248 10*3/uL (ref 150–440)
RBC: 4.74 MIL/uL (ref 4.40–5.90)
RDW: 12.7 % (ref 11.5–14.5)
WBC: 5.1 10*3/uL (ref 3.8–10.6)

## 2015-03-14 LAB — COMPREHENSIVE METABOLIC PANEL
ALBUMIN: 3.7 g/dL (ref 3.5–5.0)
ALK PHOS: 63 U/L (ref 38–126)
ALT: 54 U/L (ref 17–63)
ANION GAP: 5 (ref 5–15)
AST: 41 U/L (ref 15–41)
BUN: 13 mg/dL (ref 6–20)
CALCIUM: 8.7 mg/dL — AB (ref 8.9–10.3)
CO2: 29 mmol/L (ref 22–32)
CREATININE: 0.73 mg/dL (ref 0.61–1.24)
Chloride: 102 mmol/L (ref 101–111)
GFR calc Af Amer: >60 mL/min (ref >60)
GFR calc non Af Amer: >60 mL/min (ref >60)
GLUCOSE: 80 mg/dL (ref 65–99)
Potassium: 4.4 mmol/L (ref 3.5–5.1)
SODIUM: 136 mmol/L (ref 135–145)
Total Bilirubin: 0.4 mg/dL (ref 0.3–1.2)
Total Protein: 6.2 g/dL — ABNORMAL LOW (ref 6.5–8.1)

## 2015-03-14 LAB — MAGNESIUM: Magnesium: 2.2 mg/dL (ref 1.7–2.4)

## 2015-03-14 MED ORDER — DIPHENHYDRAMINE HCL 50 MG/ML IJ SOLN
25.0000 mg | Freq: Once | INTRAMUSCULAR | Status: AC
  Administered 2015-03-14: 25 mg via INTRAVENOUS
  Filled 2015-03-14: qty 1

## 2015-03-14 MED ORDER — DOXYCYCLINE HYCLATE 100 MG PO TABS: 100.0000 mg | ORAL_TABLET | Freq: Two times a day (BID) | ORAL | Status: DC

## 2015-03-14 MED ORDER — SODIUM CHLORIDE 0.9 % IV SOLN
Freq: Once | INTRAVENOUS | Status: AC
  Administered 2015-03-14: 11:00:00 via INTRAVENOUS
  Filled 2015-03-14: qty 1000

## 2015-03-14 MED ORDER — CETUXIMAB CHEMO IV INJECTION 200 MG/100ML
250.0000 mg/m2 | Freq: Once | INTRAVENOUS | Status: AC
  Administered 2015-03-14: 500 mg via INTRAVENOUS
  Filled 2015-03-14: qty 200

## 2015-03-14 NOTE — Progress Notes (Signed)
Patient has a new rash on his face that is not painful or itchy that started after his last treatment.  He has been using Aquaphor with not much relief but he has not been using it routinely.  Starting his XRT today.  The mouth pain when he eats is improving.

## 2015-03-15 ENCOUNTER — Ambulatory Visit: Payer: Self-pay

## 2015-03-15 ENCOUNTER — Ambulatory Visit
Admission: RE | Admit: 2015-03-15 | Discharge: 2015-03-15 | Disposition: A | Discharge status: Home / Self Care | Payer: Self-pay | Source: Ambulatory Visit | Attending: Radiation Oncology | Admitting: Radiation Oncology

## 2015-03-16 ENCOUNTER — Ambulatory Visit: Payer: Self-pay

## 2015-03-16 ENCOUNTER — Ambulatory Visit
Admission: RE | Admit: 2015-03-16 | Discharge: 2015-03-16 | Disposition: A | Discharge status: Home / Self Care | Payer: Self-pay | Source: Ambulatory Visit | Attending: Radiation Oncology | Admitting: Radiation Oncology

## 2015-03-17 ENCOUNTER — Ambulatory Visit: Payer: Self-pay

## 2015-03-17 ENCOUNTER — Ambulatory Visit
Admission: RE | Admit: 2015-03-17 | Discharge: 2015-03-17 | Disposition: A | Discharge status: Home / Self Care | Payer: Self-pay | Source: Ambulatory Visit | Attending: Radiation Oncology | Admitting: Radiation Oncology

## 2015-03-18 ENCOUNTER — Ambulatory Visit: Payer: Self-pay

## 2015-03-18 ENCOUNTER — Ambulatory Visit
Admission: RE | Admit: 2015-03-18 | Discharge: 2015-03-18 | Disposition: A | Discharge status: Home / Self Care | Payer: Self-pay | Source: Ambulatory Visit | Attending: Radiation Oncology | Admitting: Radiation Oncology

## 2015-03-21 ENCOUNTER — Inpatient Hospital Stay (HOSPITAL_BASED_OUTPATIENT_CLINIC_OR_DEPARTMENT_OTHER): Payer: Self-pay | Admitting: Oncology

## 2015-03-21 ENCOUNTER — Ambulatory Visit: Payer: Self-pay

## 2015-03-21 ENCOUNTER — Ambulatory Visit
Admission: RE | Admit: 2015-03-21 | Discharge: 2015-03-21 | Disposition: A | Discharge status: Home / Self Care | Payer: Self-pay | Source: Ambulatory Visit | Attending: Radiation Oncology | Admitting: Radiation Oncology

## 2015-03-21 ENCOUNTER — Inpatient Hospital Stay: Payer: Self-pay

## 2015-03-21 VITALS — BP 115/77 | HR 67 | Temp 96.0°F | Resp 16 | Wt 169.8 lb

## 2015-03-21 VITALS — BP 117/73 | HR 56 | Resp 18

## 2015-03-21 DIAGNOSIS — F418 Other specified anxiety disorders: Secondary | ICD-10-CM

## 2015-03-21 DIAGNOSIS — C029 Malignant neoplasm of tongue, unspecified: Secondary | ICD-10-CM

## 2015-03-21 DIAGNOSIS — I429 Cardiomyopathy, unspecified: Secondary | ICD-10-CM

## 2015-03-21 DIAGNOSIS — C770 Secondary and unspecified malignant neoplasm of lymph nodes of head, face and neck: Secondary | ICD-10-CM

## 2015-03-21 DIAGNOSIS — L270 Generalized skin eruption due to drugs and medicaments taken internally: Secondary | ICD-10-CM

## 2015-03-21 DIAGNOSIS — T451X5D Adverse effect of antineoplastic and immunosuppressive drugs, subsequent encounter: Secondary | ICD-10-CM

## 2015-03-21 DIAGNOSIS — Z79899 Other long term (current) drug therapy: Secondary | ICD-10-CM

## 2015-03-21 DIAGNOSIS — I10 Essential (primary) hypertension: Secondary | ICD-10-CM

## 2015-03-21 DIAGNOSIS — G4709 Other insomnia: Secondary | ICD-10-CM

## 2015-03-21 DIAGNOSIS — E785 Hyperlipidemia, unspecified: Secondary | ICD-10-CM

## 2015-03-21 DIAGNOSIS — G4730 Sleep apnea, unspecified: Secondary | ICD-10-CM

## 2015-03-21 DIAGNOSIS — G893 Neoplasm related pain (acute) (chronic): Secondary | ICD-10-CM

## 2015-03-21 LAB — CBC WITH DIFFERENTIAL/PLATELET
BASOS ABS: 0 10*3/uL (ref 0–0.1)
BASOS PCT: 1 %
Eosinophils Absolute: 0.1 10*3/uL (ref 0–0.7)
Eosinophils Relative: 3 %
HEMATOCRIT: 41.9 % (ref 40.0–52.0)
HEMOGLOBIN: 14.5 g/dL (ref 13.0–18.0)
LYMPHS PCT: 12 %
Lymphs Abs: 0.5 10*3/uL — ABNORMAL LOW (ref 1.0–3.6)
MCH: 31.3 pg (ref 26.0–34.0)
MCHC: 34.6 g/dL (ref 32.0–36.0)
MCV: 90.7 fL (ref 80.0–100.0)
MONOS PCT: 10 %
Monocytes Absolute: 0.4 10*3/uL (ref 0.2–1.0)
NEUTROS ABS: 3.1 10*3/uL (ref 1.4–6.5)
NEUTROS PCT: 74 %
Platelets: 256 10*3/uL (ref 150–440)
RBC: 4.62 MIL/uL (ref 4.40–5.90)
RDW: 13 % (ref 11.5–14.5)
WBC: 4.1 10*3/uL (ref 3.8–10.6)

## 2015-03-21 LAB — MAGNESIUM: Magnesium: 2.1 mg/dL (ref 1.7–2.4)

## 2015-03-21 MED ORDER — SODIUM CHLORIDE 0.9 % IV SOLN
Freq: Once | INTRAVENOUS | Status: AC
  Administered 2015-03-21: 11:00:00 via INTRAVENOUS
  Filled 2015-03-21: qty 1000

## 2015-03-21 MED ORDER — DIPHENHYDRAMINE HCL 50 MG/ML IJ SOLN
25.0000 mg | Freq: Once | INTRAMUSCULAR | Status: AC
  Administered 2015-03-21: 25 mg via INTRAVENOUS
  Filled 2015-03-21: qty 1

## 2015-03-21 MED ORDER — CETUXIMAB CHEMO IV INJECTION 200 MG/100ML
250.0000 mg/m2 | Freq: Once | INTRAVENOUS | Status: AC
  Administered 2015-03-21: 500 mg via INTRAVENOUS
  Filled 2015-03-21: qty 200

## 2015-03-21 NOTE — Progress Notes (Signed)
Patient has an acne rash on his shoulders and upper back.  Also has a red rash on face that is dry and becomes itchy without improvement when using cream.

## 2015-03-22 ENCOUNTER — Ambulatory Visit: Payer: Self-pay

## 2015-03-22 ENCOUNTER — Ambulatory Visit
Admission: RE | Admit: 2015-03-22 | Discharge: 2015-03-22 | Disposition: A | Discharge status: Home / Self Care | Payer: Self-pay | Source: Ambulatory Visit | Attending: Radiation Oncology | Admitting: Radiation Oncology

## 2015-03-23 ENCOUNTER — Ambulatory Visit: Payer: Self-pay

## 2015-03-23 ENCOUNTER — Ambulatory Visit
Admission: RE | Admit: 2015-03-23 | Discharge: 2015-03-23 | Disposition: A | Discharge status: Home / Self Care | Payer: Self-pay | Source: Ambulatory Visit | Attending: Radiation Oncology | Admitting: Radiation Oncology

## 2015-03-23 NOTE — Progress Notes (Unsigned)
Patient left a message to return his call yesterday.  PSN called patient today, but left a voice mail message since patient did not answer.

## 2015-03-24 ENCOUNTER — Ambulatory Visit: Payer: Self-pay

## 2015-03-24 ENCOUNTER — Ambulatory Visit
Admission: RE | Admit: 2015-03-24 | Discharge: 2015-03-24 | Disposition: A | Discharge status: Home / Self Care | Payer: Self-pay | Source: Ambulatory Visit | Attending: Radiation Oncology | Admitting: Radiation Oncology

## 2015-03-25 ENCOUNTER — Ambulatory Visit
Admission: RE | Admit: 2015-03-25 | Discharge: 2015-03-25 | Disposition: A | Discharge status: Home / Self Care | Payer: Self-pay | Source: Ambulatory Visit | Attending: Radiation Oncology | Admitting: Radiation Oncology

## 2015-03-25 ENCOUNTER — Ambulatory Visit: Payer: Self-pay
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
## 2015-03-27 ENCOUNTER — Ambulatory Visit
Admission: RE | Admit: 2015-03-27 | Discharge: 2015-03-27 | Disposition: A | Discharge status: Home / Self Care | Payer: Self-pay | Source: Ambulatory Visit | Attending: Radiation Oncology | Admitting: Radiation Oncology

## 2015-03-28 ENCOUNTER — Ambulatory Visit: Payer: Self-pay

## 2015-03-28 ENCOUNTER — Other Ambulatory Visit: Payer: Self-pay | Admitting: *Deleted

## 2015-03-28 ENCOUNTER — Inpatient Hospital Stay: Payer: Self-pay

## 2015-03-28 ENCOUNTER — Ambulatory Visit
Admission: RE | Admit: 2015-03-28 | Discharge: 2015-03-28 | Disposition: A | Discharge status: Home / Self Care | Payer: Self-pay | Source: Ambulatory Visit | Attending: Radiation Oncology | Admitting: Radiation Oncology

## 2015-03-28 ENCOUNTER — Inpatient Hospital Stay (HOSPITAL_BASED_OUTPATIENT_CLINIC_OR_DEPARTMENT_OTHER): Payer: Self-pay | Admitting: Oncology

## 2015-03-28 VITALS — BP 126/80 | HR 82 | Temp 97.7°F | Resp 16 | Wt 173.7 lb

## 2015-03-28 DIAGNOSIS — C029 Malignant neoplasm of tongue, unspecified: Secondary | ICD-10-CM

## 2015-03-28 DIAGNOSIS — T451X5S Adverse effect of antineoplastic and immunosuppressive drugs, sequela: Secondary | ICD-10-CM

## 2015-03-28 DIAGNOSIS — Z79899 Other long term (current) drug therapy: Secondary | ICD-10-CM

## 2015-03-28 DIAGNOSIS — C770 Secondary and unspecified malignant neoplasm of lymph nodes of head, face and neck: Secondary | ICD-10-CM

## 2015-03-28 DIAGNOSIS — L270 Generalized skin eruption due to drugs and medicaments taken internally: Secondary | ICD-10-CM

## 2015-03-28 DIAGNOSIS — I10 Essential (primary) hypertension: Secondary | ICD-10-CM

## 2015-03-28 DIAGNOSIS — G4709 Other insomnia: Secondary | ICD-10-CM

## 2015-03-28 DIAGNOSIS — G43909 Migraine, unspecified, not intractable, without status migrainosus: Secondary | ICD-10-CM

## 2015-03-28 DIAGNOSIS — G4730 Sleep apnea, unspecified: Secondary | ICD-10-CM

## 2015-03-28 DIAGNOSIS — G893 Neoplasm related pain (acute) (chronic): Secondary | ICD-10-CM

## 2015-03-28 DIAGNOSIS — F418 Other specified anxiety disorders: Secondary | ICD-10-CM

## 2015-03-28 DIAGNOSIS — E785 Hyperlipidemia, unspecified: Secondary | ICD-10-CM

## 2015-03-28 LAB — CBC WITH DIFFERENTIAL/PLATELET
Basophils Absolute: 0 10*3/uL (ref 0–0.1)
Basophils Relative: 1 %
EOS PCT: 3 %
Eosinophils Absolute: 0.2 10*3/uL (ref 0–0.7)
HEMATOCRIT: 42.7 % (ref 40.0–52.0)
Hemoglobin: 14.5 g/dL (ref 13.0–18.0)
LYMPHS ABS: 0.5 10*3/uL — AB (ref 1.0–3.6)
LYMPHS PCT: 9 %
MCH: 31.1 pg (ref 26.0–34.0)
MCHC: 33.9 g/dL (ref 32.0–36.0)
MCV: 91.7 fL (ref 80.0–100.0)
Monocytes Absolute: 0.6 10*3/uL (ref 0.2–1.0)
Monocytes Relative: 11 %
NEUTROS ABS: 4.2 10*3/uL (ref 1.4–6.5)
NEUTROS PCT: 76 %
PLATELETS: 229 10*3/uL (ref 150–440)
RBC: 4.65 MIL/uL (ref 4.40–5.90)
RDW: 13.2 % (ref 11.5–14.5)
WBC: 5.5 10*3/uL (ref 3.8–10.6)

## 2015-03-28 LAB — MAGNESIUM: Magnesium: 2.1 mg/dL (ref 1.7–2.4)

## 2015-03-28 MED ORDER — SUCRALFATE 1 G PO TABS: 1.0000 g | ORAL_TABLET | Freq: Three times a day (TID) | ORAL | Status: DC

## 2015-03-28 MED ORDER — CETUXIMAB CHEMO IV INJECTION 200 MG/100ML
250.0000 mg/m2 | Freq: Once | INTRAVENOUS | Status: AC
  Administered 2015-03-28: 500 mg via INTRAVENOUS
  Filled 2015-03-28: qty 200

## 2015-03-28 MED ORDER — DOXYCYCLINE HYCLATE 100 MG PO TABS: 100.0000 mg | ORAL_TABLET | Freq: Two times a day (BID) | ORAL | Status: DC

## 2015-03-28 MED ORDER — DIPHENHYDRAMINE HCL 50 MG/ML IJ SOLN
25.0000 mg | Freq: Once | INTRAMUSCULAR | Status: AC
  Administered 2015-03-28: 25 mg via INTRAVENOUS
  Filled 2015-03-28: qty 1

## 2015-03-28 MED ORDER — SODIUM CHLORIDE 0.9 % IV SOLN
Freq: Once | INTRAVENOUS | Status: AC
  Administered 2015-03-28: 11:00:00 via INTRAVENOUS
  Filled 2015-03-28: qty 1000

## 2015-03-28 NOTE — Progress Notes (Signed)
Patient's rash seems to have improved but he states that his face "feels tight".  Starting to have a cough during the night.  Appetite has decreased due to the change in taste and occasional pain with swallowing.
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
## 2015-03-29 ENCOUNTER — Ambulatory Visit: Payer: Self-pay

## 2015-03-29 ENCOUNTER — Ambulatory Visit
Admission: RE | Admit: 2015-03-29 | Discharge: 2015-03-29 | Disposition: A | Discharge status: Home / Self Care | Payer: Self-pay | Source: Ambulatory Visit | Attending: Radiation Oncology | Admitting: Radiation Oncology

## 2015-03-30 ENCOUNTER — Ambulatory Visit: Payer: Self-pay

## 2015-03-30 ENCOUNTER — Ambulatory Visit
Admission: RE | Admit: 2015-03-30 | Discharge: 2015-03-30 | Disposition: A | Discharge status: Home / Self Care | Payer: Self-pay | Source: Ambulatory Visit | Attending: Radiation Oncology | Admitting: Radiation Oncology

## 2015-03-31 ENCOUNTER — Ambulatory Visit: Payer: Self-pay

## 2015-03-31 ENCOUNTER — Ambulatory Visit
Admission: RE | Admit: 2015-03-31 | Discharge: 2015-03-31 | Disposition: A | Discharge status: Home / Self Care | Payer: Self-pay | Source: Ambulatory Visit | Attending: Radiation Oncology | Admitting: Radiation Oncology

## 2015-04-01 ENCOUNTER — Ambulatory Visit: Payer: Self-pay

## 2015-04-01 ENCOUNTER — Ambulatory Visit
Admission: RE | Admit: 2015-04-01 | Discharge: 2015-04-01 | Disposition: A | Discharge status: Home / Self Care | Payer: Self-pay | Source: Ambulatory Visit | Attending: Radiation Oncology | Admitting: Radiation Oncology

## 2015-04-04 ENCOUNTER — Ambulatory Visit
Admission: RE | Admit: 2015-04-04 | Discharge: 2015-04-04 | Disposition: A | Discharge status: Home / Self Care | Payer: Self-pay | Source: Ambulatory Visit | Attending: Radiation Oncology | Admitting: Radiation Oncology

## 2015-04-04 ENCOUNTER — Ambulatory Visit: Payer: Self-pay

## 2015-04-04 ENCOUNTER — Inpatient Hospital Stay (HOSPITAL_BASED_OUTPATIENT_CLINIC_OR_DEPARTMENT_OTHER): Payer: Self-pay | Admitting: Oncology

## 2015-04-04 ENCOUNTER — Other Ambulatory Visit: Payer: Self-pay | Admitting: *Deleted

## 2015-04-04 ENCOUNTER — Inpatient Hospital Stay: Payer: Self-pay | Attending: Oncology

## 2015-04-04 ENCOUNTER — Inpatient Hospital Stay: Payer: Self-pay

## 2015-04-04 VITALS — BP 135/88 | HR 82 | Temp 96.2°F | Resp 16 | Wt 166.9 lb

## 2015-04-04 DIAGNOSIS — Y842 Radiological procedure and radiotherapy as the cause of abnormal reaction of the patient, or of later complication, without mention of misadventure at the time of the procedure: Secondary | ICD-10-CM | POA: Insufficient documentation

## 2015-04-04 DIAGNOSIS — G893 Neoplasm related pain (acute) (chronic): Secondary | ICD-10-CM | POA: Insufficient documentation

## 2015-04-04 DIAGNOSIS — Z79899 Other long term (current) drug therapy: Secondary | ICD-10-CM | POA: Insufficient documentation

## 2015-04-04 DIAGNOSIS — G478 Other sleep disorders: Secondary | ICD-10-CM

## 2015-04-04 DIAGNOSIS — G43909 Migraine, unspecified, not intractable, without status migrainosus: Secondary | ICD-10-CM

## 2015-04-04 DIAGNOSIS — G4730 Sleep apnea, unspecified: Secondary | ICD-10-CM | POA: Insufficient documentation

## 2015-04-04 DIAGNOSIS — T451X5S Adverse effect of antineoplastic and immunosuppressive drugs, sequela: Secondary | ICD-10-CM

## 2015-04-04 DIAGNOSIS — F418 Other specified anxiety disorders: Secondary | ICD-10-CM

## 2015-04-04 DIAGNOSIS — I429 Cardiomyopathy, unspecified: Secondary | ICD-10-CM | POA: Insufficient documentation

## 2015-04-04 DIAGNOSIS — C029 Malignant neoplasm of tongue, unspecified: Secondary | ICD-10-CM

## 2015-04-04 DIAGNOSIS — L271 Localized skin eruption due to drugs and medicaments taken internally: Secondary | ICD-10-CM

## 2015-04-04 DIAGNOSIS — C770 Secondary and unspecified malignant neoplasm of lymph nodes of head, face and neck: Secondary | ICD-10-CM

## 2015-04-04 DIAGNOSIS — R1319 Other dysphagia: Secondary | ICD-10-CM | POA: Insufficient documentation

## 2015-04-04 DIAGNOSIS — I10 Essential (primary) hypertension: Secondary | ICD-10-CM | POA: Insufficient documentation

## 2015-04-04 DIAGNOSIS — Z5112 Encounter for antineoplastic immunotherapy: Secondary | ICD-10-CM | POA: Insufficient documentation

## 2015-04-04 DIAGNOSIS — R05 Cough: Secondary | ICD-10-CM | POA: Insufficient documentation

## 2015-04-04 DIAGNOSIS — Z800 Family history of malignant neoplasm of digestive organs: Secondary | ICD-10-CM | POA: Insufficient documentation

## 2015-04-04 DIAGNOSIS — E785 Hyperlipidemia, unspecified: Secondary | ICD-10-CM | POA: Insufficient documentation

## 2015-04-04 DIAGNOSIS — R531 Weakness: Secondary | ICD-10-CM | POA: Insufficient documentation

## 2015-04-04 DIAGNOSIS — R5381 Other malaise: Secondary | ICD-10-CM | POA: Insufficient documentation

## 2015-04-04 DIAGNOSIS — R5383 Other fatigue: Secondary | ICD-10-CM | POA: Insufficient documentation

## 2015-04-04 DIAGNOSIS — R1310 Dysphagia, unspecified: Secondary | ICD-10-CM

## 2015-04-04 LAB — CBC WITH DIFFERENTIAL/PLATELET
Basophils Absolute: 0.1 10*3/uL (ref 0–0.1)
Basophils Relative: 1 %
Eosinophils Absolute: 0.1 10*3/uL (ref 0–0.7)
Eosinophils Relative: 2 %
HEMATOCRIT: 45.8 % (ref 40.0–52.0)
HEMOGLOBIN: 15.5 g/dL (ref 13.0–18.0)
LYMPHS ABS: 0.4 10*3/uL — AB (ref 1.0–3.6)
LYMPHS PCT: 9 %
MCH: 30.8 pg (ref 26.0–34.0)
MCHC: 33.8 g/dL (ref 32.0–36.0)
MCV: 91.2 fL (ref 80.0–100.0)
Monocytes Absolute: 0.5 10*3/uL (ref 0.2–1.0)
Monocytes Relative: 11 %
NEUTROS PCT: 77 %
Neutro Abs: 3.7 10*3/uL (ref 1.4–6.5)
Platelets: 245 10*3/uL (ref 150–440)
RBC: 5.02 MIL/uL (ref 4.40–5.90)
RDW: 13.1 % (ref 11.5–14.5)
WBC: 4.7 10*3/uL (ref 3.8–10.6)

## 2015-04-04 LAB — MAGNESIUM: Magnesium: 2 mg/dL (ref 1.7–2.4)

## 2015-04-04 MED ORDER — DIPHENHYDRAMINE HCL 50 MG/ML IJ SOLN
25.0000 mg | Freq: Once | INTRAMUSCULAR | Status: AC
  Administered 2015-04-04: 25 mg via INTRAVENOUS
  Filled 2015-04-04: qty 1

## 2015-04-04 MED ORDER — DEXAMETHASONE 4 MG PO TABS: 4.0000 mg | ORAL_TABLET | Freq: Every day | ORAL | Status: DC

## 2015-04-04 MED ORDER — SODIUM CHLORIDE 0.9 % IV SOLN
Freq: Once | INTRAVENOUS | Status: AC
  Administered 2015-04-04: 10:00:00 via INTRAVENOUS
  Filled 2015-04-04: qty 1000

## 2015-04-04 MED ORDER — CETUXIMAB CHEMO IV INJECTION 200 MG/100ML
250.0000 mg/m2 | Freq: Once | INTRAVENOUS | Status: AC
  Administered 2015-04-04: 500 mg via INTRAVENOUS
  Filled 2015-04-04: qty 200

## 2015-04-04 NOTE — Progress Notes (Signed)
Patient is having a cough that increases the pain in his mouth.  Also having trouble swallowing and is having to pick which meds to take.

## 2015-04-05 ENCOUNTER — Ambulatory Visit: Payer: Self-pay

## 2015-04-06 ENCOUNTER — Ambulatory Visit: Payer: Self-pay

## 2015-04-07 ENCOUNTER — Ambulatory Visit: Payer: Self-pay

## 2015-04-07 ENCOUNTER — Ambulatory Visit
Admission: RE | Admit: 2015-04-07 | Discharge: 2015-04-07 | Disposition: A | Discharge status: Home / Self Care | Payer: Self-pay | Source: Ambulatory Visit | Attending: Radiation Oncology | Admitting: Radiation Oncology

## 2015-04-08 ENCOUNTER — Ambulatory Visit: Payer: Self-pay

## 2015-04-08 ENCOUNTER — Ambulatory Visit
Admission: RE | Admit: 2015-04-08 | Discharge: 2015-04-08 | Disposition: A | Discharge status: Home / Self Care | Payer: Self-pay | Source: Ambulatory Visit | Attending: Radiation Oncology | Admitting: Radiation Oncology

## 2015-04-11 ENCOUNTER — Ambulatory Visit: Payer: Self-pay

## 2015-04-11 ENCOUNTER — Inpatient Hospital Stay: Payer: Self-pay

## 2015-04-11 ENCOUNTER — Ambulatory Visit
Admission: RE | Admit: 2015-04-11 | Discharge: 2015-04-11 | Disposition: A | Discharge status: Home / Self Care | Payer: Self-pay | Source: Ambulatory Visit | Attending: Radiation Oncology | Admitting: Radiation Oncology

## 2015-04-11 ENCOUNTER — Inpatient Hospital Stay (HOSPITAL_BASED_OUTPATIENT_CLINIC_OR_DEPARTMENT_OTHER): Payer: Self-pay | Admitting: Oncology

## 2015-04-11 VITALS — BP 130/84 | HR 60 | Temp 96.0°F | Resp 18 | Wt 170.4 lb

## 2015-04-11 DIAGNOSIS — C029 Malignant neoplasm of tongue, unspecified: Secondary | ICD-10-CM

## 2015-04-11 DIAGNOSIS — R5383 Other fatigue: Secondary | ICD-10-CM

## 2015-04-11 DIAGNOSIS — T451X5S Adverse effect of antineoplastic and immunosuppressive drugs, sequela: Secondary | ICD-10-CM

## 2015-04-11 DIAGNOSIS — Z800 Family history of malignant neoplasm of digestive organs: Secondary | ICD-10-CM

## 2015-04-11 DIAGNOSIS — Z79899 Other long term (current) drug therapy: Secondary | ICD-10-CM

## 2015-04-11 DIAGNOSIS — R531 Weakness: Secondary | ICD-10-CM

## 2015-04-11 DIAGNOSIS — G893 Neoplasm related pain (acute) (chronic): Secondary | ICD-10-CM

## 2015-04-11 DIAGNOSIS — L271 Localized skin eruption due to drugs and medicaments taken internally: Secondary | ICD-10-CM

## 2015-04-11 DIAGNOSIS — R5381 Other malaise: Secondary | ICD-10-CM

## 2015-04-11 DIAGNOSIS — C770 Secondary and unspecified malignant neoplasm of lymph nodes of head, face and neck: Secondary | ICD-10-CM

## 2015-04-11 DIAGNOSIS — G43909 Migraine, unspecified, not intractable, without status migrainosus: Secondary | ICD-10-CM

## 2015-04-11 DIAGNOSIS — F418 Other specified anxiety disorders: Secondary | ICD-10-CM

## 2015-04-11 LAB — CBC WITH DIFFERENTIAL/PLATELET
BASOS ABS: 0 10*3/uL (ref 0–0.1)
BASOS PCT: 0 %
Eosinophils Absolute: 0 10*3/uL (ref 0–0.7)
Eosinophils Relative: 0 %
HEMATOCRIT: 44.9 % (ref 40.0–52.0)
Hemoglobin: 15.2 g/dL (ref 13.0–18.0)
LYMPHS PCT: 5 %
Lymphs Abs: 0.5 10*3/uL — ABNORMAL LOW (ref 1.0–3.6)
MCH: 31 pg (ref 26.0–34.0)
MCHC: 33.9 g/dL (ref 32.0–36.0)
MCV: 91.4 fL (ref 80.0–100.0)
Monocytes Absolute: 1.2 10*3/uL — ABNORMAL HIGH (ref 0.2–1.0)
Monocytes Relative: 13 %
NEUTROS ABS: 7.4 10*3/uL — AB (ref 1.4–6.5)
Neutrophils Relative %: 82 %
PLATELETS: 284 10*3/uL (ref 150–440)
RBC: 4.91 MIL/uL (ref 4.40–5.90)
RDW: 13.8 % (ref 11.5–14.5)
WBC: 9.1 10*3/uL (ref 3.8–10.6)

## 2015-04-11 LAB — MAGNESIUM: Magnesium: 2 mg/dL (ref 1.7–2.4)

## 2015-04-11 MED ORDER — DIPHENHYDRAMINE HCL 50 MG/ML IJ SOLN
25.0000 mg | Freq: Once | INTRAMUSCULAR | Status: AC
  Administered 2015-04-11: 25 mg via INTRAVENOUS
  Filled 2015-04-11: qty 1

## 2015-04-11 MED ORDER — CETUXIMAB CHEMO IV INJECTION 200 MG/100ML
250.0000 mg/m2 | Freq: Once | INTRAVENOUS | Status: AC
  Administered 2015-04-11: 500 mg via INTRAVENOUS
  Filled 2015-04-11: qty 100

## 2015-04-11 MED ORDER — SODIUM CHLORIDE 0.9 % IV SOLN
Freq: Once | INTRAVENOUS | Status: AC
  Administered 2015-04-11: 11:00:00 via INTRAVENOUS
  Filled 2015-04-11: qty 1000

## 2015-04-11 NOTE — Progress Notes (Signed)
Per MD, Dr. Grayland Ormond, telephone order: Comprehensive Metabolic Panel does not need to be drawn today.

## 2015-04-11 NOTE — Progress Notes (Signed)
Patient here today for ongoing follow up and treatment consideration regarding tongue cancer. Patient denies any concerns today.

## 2015-04-12 ENCOUNTER — Ambulatory Visit: Payer: Self-pay

## 2015-04-12 ENCOUNTER — Ambulatory Visit
Admission: RE | Admit: 2015-04-12 | Discharge: 2015-04-12 | Disposition: A | Discharge status: Home / Self Care | Payer: Self-pay | Source: Ambulatory Visit | Attending: Radiation Oncology | Admitting: Radiation Oncology

## 2015-04-13 ENCOUNTER — Ambulatory Visit: Payer: Self-pay

## 2015-04-13 ENCOUNTER — Ambulatory Visit
Admission: RE | Admit: 2015-04-13 | Discharge: 2015-04-13 | Disposition: A | Discharge status: Home / Self Care | Payer: Self-pay | Source: Ambulatory Visit | Attending: Radiation Oncology | Admitting: Radiation Oncology

## 2015-04-14 ENCOUNTER — Ambulatory Visit: Payer: Self-pay

## 2015-04-14 ENCOUNTER — Ambulatory Visit
Admission: RE | Admit: 2015-04-14 | Discharge: 2015-04-14 | Disposition: A | Discharge status: Home / Self Care | Payer: Self-pay | Source: Ambulatory Visit | Attending: Radiation Oncology | Admitting: Radiation Oncology
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
## 2015-04-15 ENCOUNTER — Ambulatory Visit: Payer: Self-pay

## 2015-04-15 ENCOUNTER — Ambulatory Visit
Admission: RE | Admit: 2015-04-15 | Discharge: 2015-04-15 | Disposition: A | Discharge status: Home / Self Care | Payer: Self-pay | Source: Ambulatory Visit | Attending: Radiation Oncology | Admitting: Radiation Oncology
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
## 2015-04-18 ENCOUNTER — Inpatient Hospital Stay: Payer: Self-pay

## 2015-04-18 ENCOUNTER — Inpatient Hospital Stay (HOSPITAL_BASED_OUTPATIENT_CLINIC_OR_DEPARTMENT_OTHER): Payer: Self-pay | Admitting: Internal Medicine

## 2015-04-18 ENCOUNTER — Ambulatory Visit: Payer: Self-pay

## 2015-04-18 ENCOUNTER — Ambulatory Visit
Admission: RE | Admit: 2015-04-18 | Discharge: 2015-04-18 | Disposition: A | Discharge status: Home / Self Care | Payer: Self-pay | Source: Ambulatory Visit | Attending: Radiation Oncology | Admitting: Radiation Oncology

## 2015-04-18 ENCOUNTER — Other Ambulatory Visit: Payer: Self-pay | Admitting: *Deleted

## 2015-04-18 ENCOUNTER — Other Ambulatory Visit: Payer: Self-pay

## 2015-04-18 VITALS — BP 110/73 | HR 66 | Resp 20

## 2015-04-18 VITALS — BP 125/82 | HR 70 | Temp 98.1°F | Resp 18 | Wt 176.4 lb

## 2015-04-18 DIAGNOSIS — R5381 Other malaise: Secondary | ICD-10-CM

## 2015-04-18 DIAGNOSIS — R531 Weakness: Secondary | ICD-10-CM

## 2015-04-18 DIAGNOSIS — R5383 Other fatigue: Secondary | ICD-10-CM

## 2015-04-18 DIAGNOSIS — C029 Malignant neoplasm of tongue, unspecified: Secondary | ICD-10-CM

## 2015-04-18 DIAGNOSIS — L271 Localized skin eruption due to drugs and medicaments taken internally: Secondary | ICD-10-CM

## 2015-04-18 DIAGNOSIS — R05 Cough: Secondary | ICD-10-CM

## 2015-04-18 DIAGNOSIS — R1310 Dysphagia, unspecified: Secondary | ICD-10-CM

## 2015-04-18 DIAGNOSIS — G893 Neoplasm related pain (acute) (chronic): Secondary | ICD-10-CM

## 2015-04-18 DIAGNOSIS — G43909 Migraine, unspecified, not intractable, without status migrainosus: Secondary | ICD-10-CM

## 2015-04-18 DIAGNOSIS — T451X5S Adverse effect of antineoplastic and immunosuppressive drugs, sequela: Secondary | ICD-10-CM

## 2015-04-18 DIAGNOSIS — G478 Other sleep disorders: Secondary | ICD-10-CM

## 2015-04-18 DIAGNOSIS — C770 Secondary and unspecified malignant neoplasm of lymph nodes of head, face and neck: Secondary | ICD-10-CM

## 2015-04-18 LAB — CBC WITH DIFFERENTIAL/PLATELET
Basophils Absolute: 0 10*3/uL (ref 0–0.1)
Basophils Relative: 0 %
Eosinophils Absolute: 0 10*3/uL (ref 0–0.7)
Eosinophils Relative: 0 %
HEMATOCRIT: 46.3 % (ref 40.0–52.0)
HEMOGLOBIN: 15.1 g/dL (ref 13.0–18.0)
LYMPHS ABS: 0.3 10*3/uL — AB (ref 1.0–3.6)
Lymphocytes Relative: 2 %
MCH: 30.5 pg (ref 26.0–34.0)
MCHC: 32.6 g/dL (ref 32.0–36.0)
MCV: 93.6 fL (ref 80.0–100.0)
MONOS PCT: 12 %
Monocytes Absolute: 1.5 10*3/uL — ABNORMAL HIGH (ref 0.2–1.0)
NEUTROS ABS: 11 10*3/uL — AB (ref 1.4–6.5)
NEUTROS PCT: 86 %
Platelets: 241 10*3/uL (ref 150–440)
RBC: 4.94 MIL/uL (ref 4.40–5.90)
RDW: 14.2 % (ref 11.5–14.5)
WBC: 12.8 10*3/uL — ABNORMAL HIGH (ref 3.8–10.6)

## 2015-04-18 LAB — MAGNESIUM: Magnesium: 1.8 mg/dL (ref 1.7–2.4)

## 2015-04-18 MED ORDER — CETUXIMAB CHEMO IV INJECTION 200 MG/100ML
250.0000 mg/m2 | Freq: Once | INTRAVENOUS | Status: AC
  Administered 2015-04-18: 500 mg via INTRAVENOUS
  Filled 2015-04-18: qty 200

## 2015-04-18 MED ORDER — DIPHENHYDRAMINE HCL 50 MG/ML IJ SOLN
25.0000 mg | Freq: Once | INTRAMUSCULAR | Status: AC
  Administered 2015-04-18: 25 mg via INTRAVENOUS
  Filled 2015-04-18: qty 1

## 2015-04-18 MED ORDER — SODIUM CHLORIDE 0.9 % IV SOLN
Freq: Once | INTRAVENOUS | Status: AC
  Administered 2015-04-18: 11:00:00 via INTRAVENOUS
  Filled 2015-04-18: qty 1000
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
## 2015-04-18 NOTE — Progress Notes (Signed)
Patient's dysphagia has improved and is only using the Sucralfate on a PRN basis.

## 2015-04-19 ENCOUNTER — Ambulatory Visit: Payer: Self-pay

## 2015-04-20 ENCOUNTER — Ambulatory Visit: Payer: Self-pay

## 2015-04-20 ENCOUNTER — Ambulatory Visit
Admission: RE | Admit: 2015-04-20 | Discharge: 2015-04-20 | Disposition: A | Discharge status: Home / Self Care | Payer: Self-pay | Source: Ambulatory Visit | Attending: Radiation Oncology | Admitting: Radiation Oncology

## 2015-04-25 ENCOUNTER — Ambulatory Visit: Payer: Self-pay

## 2015-04-25 ENCOUNTER — Inpatient Hospital Stay (HOSPITAL_BASED_OUTPATIENT_CLINIC_OR_DEPARTMENT_OTHER): Payer: Self-pay | Admitting: Oncology

## 2015-04-25 ENCOUNTER — Inpatient Hospital Stay: Payer: Self-pay

## 2015-04-25 ENCOUNTER — Ambulatory Visit
Admission: RE | Admit: 2015-04-25 | Discharge: 2015-04-25 | Disposition: A | Discharge status: Home / Self Care | Payer: Self-pay | Source: Ambulatory Visit | Attending: Radiation Oncology | Admitting: Radiation Oncology

## 2015-04-25 VITALS — HR 71 | Temp 96.9°F | Resp 20 | Wt 173.5 lb

## 2015-04-25 DIAGNOSIS — R531 Weakness: Secondary | ICD-10-CM

## 2015-04-25 DIAGNOSIS — R5381 Other malaise: Secondary | ICD-10-CM

## 2015-04-25 DIAGNOSIS — R1319 Other dysphagia: Secondary | ICD-10-CM

## 2015-04-25 DIAGNOSIS — Y842 Radiological procedure and radiotherapy as the cause of abnormal reaction of the patient, or of later complication, without mention of misadventure at the time of the procedure: Secondary | ICD-10-CM

## 2015-04-25 DIAGNOSIS — T451X5S Adverse effect of antineoplastic and immunosuppressive drugs, sequela: Secondary | ICD-10-CM

## 2015-04-25 DIAGNOSIS — G893 Neoplasm related pain (acute) (chronic): Secondary | ICD-10-CM

## 2015-04-25 DIAGNOSIS — C029 Malignant neoplasm of tongue, unspecified: Secondary | ICD-10-CM

## 2015-04-25 DIAGNOSIS — F418 Other specified anxiety disorders: Secondary | ICD-10-CM

## 2015-04-25 DIAGNOSIS — G43909 Migraine, unspecified, not intractable, without status migrainosus: Secondary | ICD-10-CM

## 2015-04-25 DIAGNOSIS — C770 Secondary and unspecified malignant neoplasm of lymph nodes of head, face and neck: Secondary | ICD-10-CM

## 2015-04-25 DIAGNOSIS — R5383 Other fatigue: Secondary | ICD-10-CM

## 2015-04-25 DIAGNOSIS — L271 Localized skin eruption due to drugs and medicaments taken internally: Secondary | ICD-10-CM

## 2015-04-25 DIAGNOSIS — G478 Other sleep disorders: Secondary | ICD-10-CM

## 2015-04-25 LAB — CBC WITH DIFFERENTIAL/PLATELET
BASOS ABS: 0 10*3/uL (ref 0–0.1)
Basophils Relative: 0 %
Eosinophils Absolute: 0.1 10*3/uL (ref 0–0.7)
Eosinophils Relative: 1 %
HEMATOCRIT: 45.1 % (ref 40.0–52.0)
Hemoglobin: 14.9 g/dL (ref 13.0–18.0)
LYMPHS ABS: 0.5 10*3/uL — AB (ref 1.0–3.6)
LYMPHS PCT: 5 %
MCH: 31 pg (ref 26.0–34.0)
MCHC: 33.1 g/dL (ref 32.0–36.0)
MCV: 93.5 fL (ref 80.0–100.0)
MONO ABS: 1.1 10*3/uL — AB (ref 0.2–1.0)
MONOS PCT: 11 %
NEUTROS ABS: 8.1 10*3/uL — AB (ref 1.4–6.5)
Neutrophils Relative %: 83 %
Platelets: 192 10*3/uL (ref 150–440)
RBC: 4.83 MIL/uL (ref 4.40–5.90)
RDW: 14.1 % (ref 11.5–14.5)
WBC: 9.8 10*3/uL (ref 3.8–10.6)

## 2015-04-25 LAB — MAGNESIUM: Magnesium: 1.8 mg/dL (ref 1.7–2.4)

## 2015-04-25 NOTE — Progress Notes (Signed)
Patient here today for ongoing follow up and treatment consideration regarding tongue cancer. Patient denies any concerns today.

## 2015-04-26 ENCOUNTER — Ambulatory Visit
Admission: RE | Admit: 2015-04-26 | Discharge: 2015-04-26 | Disposition: A | Discharge status: Home / Self Care | Payer: Self-pay | Source: Ambulatory Visit | Attending: Radiation Oncology | Admitting: Radiation Oncology

## 2015-04-26 ENCOUNTER — Inpatient Hospital Stay: Payer: Self-pay

## 2015-04-26 ENCOUNTER — Ambulatory Visit: Payer: Self-pay

## 2015-04-26 VITALS — BP 118/77 | HR 60 | Temp 97.0°F | Resp 20

## 2015-04-26 DIAGNOSIS — C029 Malignant neoplasm of tongue, unspecified: Secondary | ICD-10-CM

## 2015-04-26 MED ORDER — DIPHENHYDRAMINE HCL 50 MG/ML IJ SOLN
25.0000 mg | Freq: Once | INTRAMUSCULAR | Status: AC
  Administered 2015-04-26: 25 mg via INTRAVENOUS
  Filled 2015-04-26: qty 1

## 2015-04-26 MED ORDER — SODIUM CHLORIDE 0.9 % IV SOLN
Freq: Once | INTRAVENOUS | Status: AC
  Administered 2015-04-26: 10:00:00 via INTRAVENOUS
  Filled 2015-04-26: qty 1000

## 2015-04-26 MED ORDER — CETUXIMAB CHEMO IV INJECTION 200 MG/100ML
250.0000 mg/m2 | Freq: Once | INTRAVENOUS | Status: AC
  Administered 2015-04-26: 500 mg via INTRAVENOUS
  Filled 2015-04-26: qty 200

## 2015-04-27 ENCOUNTER — Other Ambulatory Visit: Payer: Self-pay | Admitting: *Deleted

## 2015-04-27 ENCOUNTER — Ambulatory Visit
Admission: RE | Admit: 2015-04-27 | Discharge: 2015-04-27 | Disposition: A | Discharge status: Home / Self Care | Payer: Self-pay | Source: Ambulatory Visit | Attending: Radiation Oncology | Admitting: Radiation Oncology

## 2015-04-27 ENCOUNTER — Ambulatory Visit: Payer: Self-pay

## 2015-04-27 MED ORDER — DEXAMETHASONE 4 MG PO TABS: 4.0000 mg | ORAL_TABLET | Freq: Every day | ORAL | Status: DC

## 2015-04-28 ENCOUNTER — Ambulatory Visit
Admission: RE | Admit: 2015-04-28 | Discharge: 2015-04-28 | Disposition: A | Discharge status: Home / Self Care | Payer: Self-pay | Source: Ambulatory Visit | Attending: Radiation Oncology | Admitting: Radiation Oncology

## 2015-04-28 ENCOUNTER — Ambulatory Visit: Payer: Self-pay

## 2015-04-29 ENCOUNTER — Ambulatory Visit: Payer: Self-pay

## 2015-04-29 ENCOUNTER — Ambulatory Visit
Admission: RE | Admit: 2015-04-29 | Discharge: 2015-04-29 | Disposition: A | Discharge status: Home / Self Care | Payer: Self-pay | Source: Ambulatory Visit | Attending: Radiation Oncology | Admitting: Radiation Oncology
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
## 2015-05-02 ENCOUNTER — Ambulatory Visit: Payer: Self-pay

## 2015-05-02 ENCOUNTER — Inpatient Hospital Stay: Payer: Self-pay | Attending: Oncology

## 2015-05-02 ENCOUNTER — Inpatient Hospital Stay (HOSPITAL_BASED_OUTPATIENT_CLINIC_OR_DEPARTMENT_OTHER): Payer: Self-pay | Admitting: Oncology

## 2015-05-02 ENCOUNTER — Inpatient Hospital Stay: Payer: Self-pay

## 2015-05-02 ENCOUNTER — Ambulatory Visit
Admission: RE | Admit: 2015-05-02 | Discharge: 2015-05-02 | Disposition: A | Discharge status: Home / Self Care | Payer: Self-pay | Source: Ambulatory Visit | Attending: Radiation Oncology | Admitting: Radiation Oncology

## 2015-05-02 VITALS — BP 120/74 | HR 61 | Temp 98.1°F | Resp 16 | Wt 175.5 lb

## 2015-05-02 DIAGNOSIS — L271 Localized skin eruption due to drugs and medicaments taken internally: Secondary | ICD-10-CM | POA: Insufficient documentation

## 2015-05-02 DIAGNOSIS — E785 Hyperlipidemia, unspecified: Secondary | ICD-10-CM

## 2015-05-02 DIAGNOSIS — C029 Malignant neoplasm of tongue, unspecified: Secondary | ICD-10-CM

## 2015-05-02 DIAGNOSIS — C770 Secondary and unspecified malignant neoplasm of lymph nodes of head, face and neck: Secondary | ICD-10-CM | POA: Insufficient documentation

## 2015-05-02 DIAGNOSIS — Z923 Personal history of irradiation: Secondary | ICD-10-CM | POA: Insufficient documentation

## 2015-05-02 DIAGNOSIS — G4730 Sleep apnea, unspecified: Secondary | ICD-10-CM

## 2015-05-02 DIAGNOSIS — Y842 Radiological procedure and radiotherapy as the cause of abnormal reaction of the patient, or of later complication, without mention of misadventure at the time of the procedure: Secondary | ICD-10-CM | POA: Insufficient documentation

## 2015-05-02 DIAGNOSIS — R1319 Other dysphagia: Secondary | ICD-10-CM | POA: Insufficient documentation

## 2015-05-02 DIAGNOSIS — I10 Essential (primary) hypertension: Secondary | ICD-10-CM

## 2015-05-02 DIAGNOSIS — Z79899 Other long term (current) drug therapy: Secondary | ICD-10-CM

## 2015-05-02 DIAGNOSIS — F418 Other specified anxiety disorders: Secondary | ICD-10-CM

## 2015-05-02 DIAGNOSIS — Z5112 Encounter for antineoplastic immunotherapy: Secondary | ICD-10-CM | POA: Insufficient documentation

## 2015-05-02 DIAGNOSIS — C760 Malignant neoplasm of head, face and neck: Secondary | ICD-10-CM

## 2015-05-02 DIAGNOSIS — G479 Sleep disorder, unspecified: Secondary | ICD-10-CM

## 2015-05-02 LAB — CBC WITH DIFFERENTIAL/PLATELET
BASOS PCT: 0 %
Basophils Absolute: 0 10*3/uL (ref 0–0.1)
EOS ABS: 0.1 10*3/uL (ref 0–0.7)
EOS PCT: 1 %
HCT: 44.5 % (ref 40.0–52.0)
Hemoglobin: 14.8 g/dL (ref 13.0–18.0)
LYMPHS ABS: 0.3 10*3/uL — AB (ref 1.0–3.6)
Lymphocytes Relative: 4 %
MCH: 31.2 pg (ref 26.0–34.0)
MCHC: 33.4 g/dL (ref 32.0–36.0)
MCV: 93.6 fL (ref 80.0–100.0)
MONO ABS: 1 10*3/uL (ref 0.2–1.0)
MONOS PCT: 11 %
Neutro Abs: 7.7 10*3/uL — ABNORMAL HIGH (ref 1.4–6.5)
Neutrophils Relative %: 84 %
Platelets: 184 10*3/uL (ref 150–440)
RBC: 4.75 MIL/uL (ref 4.40–5.90)
RDW: 14.9 % — AB (ref 11.5–14.5)
WBC: 9.1 10*3/uL (ref 3.8–10.6)

## 2015-05-02 LAB — MAGNESIUM: Magnesium: 1.8 mg/dL (ref 1.7–2.4)

## 2015-05-02 MED ORDER — DIPHENHYDRAMINE HCL 50 MG/ML IJ SOLN
25.0000 mg | Freq: Once | INTRAMUSCULAR | Status: AC
  Administered 2015-05-02: 25 mg via INTRAVENOUS
  Filled 2015-05-02: qty 1

## 2015-05-02 MED ORDER — SODIUM CHLORIDE 0.9 % IV SOLN
Freq: Once | INTRAVENOUS | Status: AC
  Administered 2015-05-02: 11:00:00 via INTRAVENOUS
  Filled 2015-05-02: qty 1000

## 2015-05-02 MED ORDER — CETUXIMAB CHEMO IV INJECTION 200 MG/100ML
250.0000 mg/m2 | Freq: Once | INTRAVENOUS | Status: AC
Start: 2015-05-02 — End: 2015-05-02
  Administered 2015-05-02: 500 mg via INTRAVENOUS
  Filled 2015-05-02: qty 250

## 2015-05-02 NOTE — Progress Notes (Signed)
Patient is taking the Doxycycline for rash and he says that the rash does bother him at times.

## 2015-05-03 ENCOUNTER — Ambulatory Visit: Payer: Self-pay

## 2015-05-03 ENCOUNTER — Ambulatory Visit
Admission: RE | Admit: 2015-05-03 | Discharge: 2015-05-03 | Disposition: A | Discharge status: Home / Self Care | Payer: Self-pay | Source: Ambulatory Visit | Attending: Radiation Oncology | Admitting: Radiation Oncology

## 2015-05-04 ENCOUNTER — Ambulatory Visit
Admission: RE | Admit: 2015-05-04 | Discharge: 2015-05-04 | Disposition: A | Discharge status: Home / Self Care | Payer: Self-pay | Source: Ambulatory Visit | Attending: Radiation Oncology | Admitting: Radiation Oncology

## 2015-05-05 ENCOUNTER — Ambulatory Visit
Admission: RE | Admit: 2015-05-05 | Discharge: 2015-05-05 | Disposition: A | Discharge status: Home / Self Care | Payer: Self-pay | Source: Ambulatory Visit | Attending: Radiation Oncology | Admitting: Radiation Oncology

## 2015-05-05 ENCOUNTER — Ambulatory Visit: Payer: Self-pay

## 2015-05-06 ENCOUNTER — Ambulatory Visit
Admission: RE | Admit: 2015-05-06 | Discharge: 2015-05-06 | Disposition: A | Discharge status: Home / Self Care | Payer: Self-pay | Source: Ambulatory Visit | Attending: Radiation Oncology | Admitting: Radiation Oncology
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
## 2015-06-13 ENCOUNTER — Encounter: Payer: Self-pay | Admitting: Radiation Oncology

## 2015-06-13 ENCOUNTER — Encounter
Admission: RE | Admit: 2015-06-13 | Discharge: 2015-06-13 | Disposition: A | Discharge status: Home / Self Care | Payer: BLUE CROSS/BLUE SHIELD | Source: Ambulatory Visit | Attending: Oncology | Admitting: Oncology

## 2015-06-13 ENCOUNTER — Inpatient Hospital Stay: Payer: BLUE CROSS/BLUE SHIELD | Attending: Oncology

## 2015-06-13 ENCOUNTER — Ambulatory Visit
Admission: RE | Admit: 2015-06-13 | Discharge: 2015-06-13 | Disposition: A | Discharge status: Home / Self Care | Payer: BLUE CROSS/BLUE SHIELD | Source: Ambulatory Visit | Attending: Radiation Oncology | Admitting: Radiation Oncology

## 2015-06-13 VITALS — BP 129/89 | HR 106 | Temp 96.4°F | Resp 18 | Ht 66.0 in | Wt 182.8 lb

## 2015-06-13 DIAGNOSIS — L271 Localized skin eruption due to drugs and medicaments taken internally: Secondary | ICD-10-CM | POA: Insufficient documentation

## 2015-06-13 DIAGNOSIS — R531 Weakness: Secondary | ICD-10-CM | POA: Insufficient documentation

## 2015-06-13 DIAGNOSIS — C029 Malignant neoplasm of tongue, unspecified: Secondary | ICD-10-CM | POA: Insufficient documentation

## 2015-06-13 DIAGNOSIS — R110 Nausea: Secondary | ICD-10-CM | POA: Insufficient documentation

## 2015-06-13 DIAGNOSIS — T451X5S Adverse effect of antineoplastic and immunosuppressive drugs, sequela: Secondary | ICD-10-CM | POA: Insufficient documentation

## 2015-06-13 DIAGNOSIS — R51 Headache: Secondary | ICD-10-CM | POA: Insufficient documentation

## 2015-06-13 DIAGNOSIS — E785 Hyperlipidemia, unspecified: Secondary | ICD-10-CM | POA: Insufficient documentation

## 2015-06-13 DIAGNOSIS — R197 Diarrhea, unspecified: Secondary | ICD-10-CM | POA: Insufficient documentation

## 2015-06-13 DIAGNOSIS — G4730 Sleep apnea, unspecified: Secondary | ICD-10-CM | POA: Insufficient documentation

## 2015-06-13 DIAGNOSIS — F418 Other specified anxiety disorders: Secondary | ICD-10-CM | POA: Insufficient documentation

## 2015-06-13 DIAGNOSIS — R1319 Other dysphagia: Secondary | ICD-10-CM | POA: Insufficient documentation

## 2015-06-13 DIAGNOSIS — Z923 Personal history of irradiation: Secondary | ICD-10-CM | POA: Insufficient documentation

## 2015-06-13 DIAGNOSIS — R5383 Other fatigue: Secondary | ICD-10-CM | POA: Insufficient documentation

## 2015-06-13 DIAGNOSIS — R5381 Other malaise: Secondary | ICD-10-CM | POA: Insufficient documentation

## 2015-06-13 DIAGNOSIS — R630 Anorexia: Secondary | ICD-10-CM | POA: Insufficient documentation

## 2015-06-13 DIAGNOSIS — Z79899 Other long term (current) drug therapy: Secondary | ICD-10-CM | POA: Insufficient documentation

## 2015-06-13 DIAGNOSIS — C770 Secondary and unspecified malignant neoplasm of lymph nodes of head, face and neck: Secondary | ICD-10-CM | POA: Insufficient documentation

## 2015-06-13 DIAGNOSIS — B370 Candidal stomatitis: Secondary | ICD-10-CM | POA: Insufficient documentation

## 2015-06-13 DIAGNOSIS — I10 Essential (primary) hypertension: Secondary | ICD-10-CM | POA: Insufficient documentation

## 2015-06-13 DIAGNOSIS — G479 Sleep disorder, unspecified: Secondary | ICD-10-CM | POA: Insufficient documentation

## 2015-06-13 DIAGNOSIS — C760 Malignant neoplasm of head, face and neck: Secondary | ICD-10-CM | POA: Diagnosis not present

## 2015-06-13 DIAGNOSIS — Z5111 Encounter for antineoplastic chemotherapy: Secondary | ICD-10-CM | POA: Insufficient documentation

## 2015-06-13 DIAGNOSIS — G43909 Migraine, unspecified, not intractable, without status migrainosus: Secondary | ICD-10-CM | POA: Insufficient documentation

## 2015-06-13 DIAGNOSIS — I429 Cardiomyopathy, unspecified: Secondary | ICD-10-CM | POA: Insufficient documentation

## 2015-06-13 DIAGNOSIS — R05 Cough: Secondary | ICD-10-CM | POA: Insufficient documentation

## 2015-06-13 LAB — GLUCOSE, CAPILLARY: GLUCOSE-CAPILLARY: 91 mg/dL (ref 65–99)

## 2015-06-13 MED ORDER — FLUDEOXYGLUCOSE F - 18 (FDG) INJECTION
12.9000 | Freq: Once | INTRAVENOUS | Status: AC | PRN
  Administered 2015-06-13: 12.9 via INTRAVENOUS

## 2015-06-13 NOTE — Progress Notes (Signed)
Radiation Oncology Follow up Note  Name: Gavin Barnes   Date:   06/13/2015 MRN:  [id number] DOB: [birth year]    This 60 y.o. male presents to the clinic today for follow-up for head and neck cancer stage TII N2 B M0 squamous cell carcinoma of the right lateral tongue status post concurrent chemoradiation. He is seen today in routine follow-up and is doing 1 is having no dysphagia or head and neck pain. Had a PET CT scan done today showing markedly decreased hypermetabolic activity in the region of the right lateral tongue resolution of right cervical hypermetabolic at it neuropathy.  REFERRING PROVIDER: Lloyd Huger, MD  HPI: Patient is a 60 year old male now one month out having completed combined modality treatment for squamous cell carcinoma the right lateral tongue with cervical lymph node involvement.  COMPLICATIONS OF TREATMENT: none  FOLLOW UP COMPLIANCE: keeps appointments   PHYSICAL EXAM:  BP 129/89 mmHg  Pulse 106  Temp(Src) 96.4 F (35.8 C)  Resp 18  Ht 5\' 6"  (1.676 m)  Wt 182 lb 12.2 oz (82.9 kg)  BMI 29.51 kg/m2 Oral cavity is clear he does have tethering towards the area of right lateral tongue excision and tumor growth. By palpation and inspection no evidence of disease is noted indirect mirror examination shows base of tongue vallecula with no evidence of disease upper airway is clear neck is clear without evidence of subject gastric cervical or supraclavicular adenopathy. Well-developed well-nourished patient in NAD. HEENT reveals PERLA, EOMI, discs not visualized.  Oral cavity is clear. No oral mucosal lesions are identified. Neck is clear without evidence of cervical or supraclavicular adenopathy. Lungs are clear to A&P. Cardiac examination is essentially unremarkable with regular rate and rhythm without murmur rub or thrill. Abdomen is benign with no organomegaly or masses noted. Motor sensory and DTR levels are equal and symmetric in the upper and lower  extremities. Cranial nerves II through XII are grossly intact. Proprioception is intact. No peripheral adenopathy or edema is identified. No motor or sensory levels are noted. Crude visual fields are within normal range.  RADIOLOGY RESULTS: Recent PET CT scan is reviewed and compatible with prior study  PLAN: Present time he set an excellent response to therapy very faint hypermetabolic activity remaining in that area. Would like to see repeat PET CT scan done in about 3 months. Otherwise he's having no symptoms at this time. I've set him up to be followed by ENT. I've asked to see him back in 3-4 months for follow-up. Patient knows to call sooner with any concerns.  I would like to take this opportunity for allowing me to participate in the care of your patient.Armstead Peaks., MD

## 2015-06-13 NOTE — Progress Notes (Unsigned)
Survivorship Care Plan visit completed.  Treatment summary reviewed and given to patient.   ASCO booklet reviewed and given to patient.  CARE program and Cancer Transitions discussed with patient and other resources discussed that cancer center provides.  Patient verbalized understanding.

## 2015-06-15 ENCOUNTER — Inpatient Hospital Stay (HOSPITAL_BASED_OUTPATIENT_CLINIC_OR_DEPARTMENT_OTHER): Payer: BLUE CROSS/BLUE SHIELD | Admitting: Oncology

## 2015-06-15 ENCOUNTER — Inpatient Hospital Stay: Payer: BLUE CROSS/BLUE SHIELD

## 2015-06-15 VITALS — BP 118/80 | HR 88 | Temp 98.0°F | Resp 18 | Wt 181.9 lb

## 2015-06-15 DIAGNOSIS — Z79899 Other long term (current) drug therapy: Secondary | ICD-10-CM | POA: Diagnosis not present

## 2015-06-15 DIAGNOSIS — G479 Sleep disorder, unspecified: Secondary | ICD-10-CM

## 2015-06-15 DIAGNOSIS — R630 Anorexia: Secondary | ICD-10-CM | POA: Diagnosis not present

## 2015-06-15 DIAGNOSIS — R5381 Other malaise: Secondary | ICD-10-CM | POA: Diagnosis not present

## 2015-06-15 DIAGNOSIS — I429 Cardiomyopathy, unspecified: Secondary | ICD-10-CM | POA: Diagnosis not present

## 2015-06-15 DIAGNOSIS — I10 Essential (primary) hypertension: Secondary | ICD-10-CM

## 2015-06-15 DIAGNOSIS — C029 Malignant neoplasm of tongue, unspecified: Secondary | ICD-10-CM

## 2015-06-15 DIAGNOSIS — C760 Malignant neoplasm of head, face and neck: Secondary | ICD-10-CM

## 2015-06-15 DIAGNOSIS — Z923 Personal history of irradiation: Secondary | ICD-10-CM

## 2015-06-15 DIAGNOSIS — R05 Cough: Secondary | ICD-10-CM | POA: Diagnosis not present

## 2015-06-15 DIAGNOSIS — F419 Anxiety disorder, unspecified: Secondary | ICD-10-CM

## 2015-06-15 DIAGNOSIS — E785 Hyperlipidemia, unspecified: Secondary | ICD-10-CM | POA: Diagnosis not present

## 2015-06-15 DIAGNOSIS — B370 Candidal stomatitis: Secondary | ICD-10-CM | POA: Diagnosis not present

## 2015-06-15 DIAGNOSIS — G4730 Sleep apnea, unspecified: Secondary | ICD-10-CM

## 2015-06-15 DIAGNOSIS — C770 Secondary and unspecified malignant neoplasm of lymph nodes of head, face and neck: Secondary | ICD-10-CM | POA: Diagnosis not present

## 2015-06-15 DIAGNOSIS — R197 Diarrhea, unspecified: Secondary | ICD-10-CM | POA: Diagnosis not present

## 2015-06-15 DIAGNOSIS — L271 Localized skin eruption due to drugs and medicaments taken internally: Secondary | ICD-10-CM

## 2015-06-15 DIAGNOSIS — G43909 Migraine, unspecified, not intractable, without status migrainosus: Secondary | ICD-10-CM

## 2015-06-15 DIAGNOSIS — Z5111 Encounter for antineoplastic chemotherapy: Secondary | ICD-10-CM | POA: Diagnosis not present

## 2015-06-15 DIAGNOSIS — T451X5S Adverse effect of antineoplastic and immunosuppressive drugs, sequela: Secondary | ICD-10-CM

## 2015-06-15 DIAGNOSIS — R1319 Other dysphagia: Secondary | ICD-10-CM | POA: Diagnosis not present

## 2015-06-15 DIAGNOSIS — F418 Other specified anxiety disorders: Secondary | ICD-10-CM | POA: Diagnosis not present

## 2015-06-15 DIAGNOSIS — R110 Nausea: Secondary | ICD-10-CM | POA: Diagnosis not present

## 2015-06-15 DIAGNOSIS — R51 Headache: Secondary | ICD-10-CM | POA: Diagnosis not present

## 2015-06-15 DIAGNOSIS — R5383 Other fatigue: Secondary | ICD-10-CM | POA: Diagnosis not present

## 2015-06-15 DIAGNOSIS — R531 Weakness: Secondary | ICD-10-CM | POA: Diagnosis not present

## 2015-06-15 LAB — CBC WITH DIFFERENTIAL/PLATELET
BASOS ABS: 0 10*3/uL (ref 0–0.1)
BASOS PCT: 1 %
Eosinophils Absolute: 0.1 10*3/uL (ref 0–0.7)
Eosinophils Relative: 2 %
HEMATOCRIT: 43.9 % (ref 40.0–52.0)
Hemoglobin: 15 g/dL (ref 13.0–18.0)
LYMPHS PCT: 12 %
Lymphs Abs: 0.5 10*3/uL — ABNORMAL LOW (ref 1.0–3.6)
MCH: 30.7 pg (ref 26.0–34.0)
MCHC: 34.1 g/dL (ref 32.0–36.0)
MCV: 90 fL (ref 80.0–100.0)
MONO ABS: 0.6 10*3/uL (ref 0.2–1.0)
Monocytes Relative: 15 %
NEUTROS ABS: 2.7 10*3/uL (ref 1.4–6.5)
Neutrophils Relative %: 70 %
PLATELETS: 268 10*3/uL (ref 150–440)
RBC: 4.88 MIL/uL (ref 4.40–5.90)
RDW: 14.5 % (ref 11.5–14.5)
WBC: 3.9 10*3/uL (ref 3.8–10.6)

## 2015-06-15 LAB — MAGNESIUM: Magnesium: 2 mg/dL (ref 1.7–2.4)

## 2015-06-15 LAB — COMPREHENSIVE METABOLIC PANEL
ALBUMIN: 3.6 g/dL (ref 3.5–5.0)
ALK PHOS: 60 U/L (ref 38–126)
ALT: 19 U/L (ref 17–63)
ANION GAP: 4 — AB (ref 5–15)
AST: 18 U/L (ref 15–41)
BILIRUBIN TOTAL: 0.7 mg/dL (ref 0.3–1.2)
BUN: 12 mg/dL (ref 6–20)
CALCIUM: 8.8 mg/dL — AB (ref 8.9–10.3)
CO2: 27 mmol/L (ref 22–32)
Chloride: 105 mmol/L (ref 101–111)
Creatinine, Ser: 0.86 mg/dL (ref 0.61–1.24)
GLUCOSE: 113 mg/dL — AB (ref 65–99)
POTASSIUM: 3.8 mmol/L (ref 3.5–5.1)
Sodium: 136 mmol/L (ref 135–145)
TOTAL PROTEIN: 6.2 g/dL — AB (ref 6.5–8.1)

## 2015-06-17 MED ORDER — PROCHLORPERAZINE MALEATE 10 MG PO TABS: 10.0000 mg | ORAL_TABLET | Freq: Four times a day (QID) | ORAL | Status: DC | PRN

## 2015-06-17 MED ORDER — LIDOCAINE-PRILOCAINE 2.5-2.5 % EX CREA: TOPICAL_CREAM | CUTANEOUS | Status: DC

## 2015-06-17 MED ORDER — ONDANSETRON HCL 8 MG PO TABS: 8.0000 mg | ORAL_TABLET | Freq: Two times a day (BID) | ORAL | Status: DC | PRN
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
## 2015-06-20 ENCOUNTER — Inpatient Hospital Stay (HOSPITAL_BASED_OUTPATIENT_CLINIC_OR_DEPARTMENT_OTHER): Payer: BLUE CROSS/BLUE SHIELD | Admitting: Oncology

## 2015-06-20 ENCOUNTER — Inpatient Hospital Stay: Payer: BLUE CROSS/BLUE SHIELD

## 2015-06-20 VITALS — BP 143/92 | HR 88 | Resp 18

## 2015-06-20 VITALS — BP 129/86 | HR 86 | Temp 97.4°F | Resp 18 | Wt 183.9 lb

## 2015-06-20 DIAGNOSIS — G4730 Sleep apnea, unspecified: Secondary | ICD-10-CM

## 2015-06-20 DIAGNOSIS — G43909 Migraine, unspecified, not intractable, without status migrainosus: Secondary | ICD-10-CM

## 2015-06-20 DIAGNOSIS — G479 Sleep disorder, unspecified: Secondary | ICD-10-CM

## 2015-06-20 DIAGNOSIS — Z79899 Other long term (current) drug therapy: Secondary | ICD-10-CM

## 2015-06-20 DIAGNOSIS — C029 Malignant neoplasm of tongue, unspecified: Secondary | ICD-10-CM

## 2015-06-20 DIAGNOSIS — R05 Cough: Secondary | ICD-10-CM

## 2015-06-20 DIAGNOSIS — E785 Hyperlipidemia, unspecified: Secondary | ICD-10-CM

## 2015-06-20 DIAGNOSIS — Z923 Personal history of irradiation: Secondary | ICD-10-CM | POA: Diagnosis not present

## 2015-06-20 DIAGNOSIS — C770 Secondary and unspecified malignant neoplasm of lymph nodes of head, face and neck: Secondary | ICD-10-CM | POA: Diagnosis not present

## 2015-06-20 DIAGNOSIS — I10 Essential (primary) hypertension: Secondary | ICD-10-CM

## 2015-06-20 DIAGNOSIS — F418 Other specified anxiety disorders: Secondary | ICD-10-CM

## 2015-06-20 LAB — BASIC METABOLIC PANEL
ANION GAP: 6 (ref 5–15)
BUN: 11 mg/dL (ref 6–20)
CALCIUM: 8.6 mg/dL — AB (ref 8.9–10.3)
CHLORIDE: 105 mmol/L (ref 101–111)
CO2: 24 mmol/L (ref 22–32)
Creatinine, Ser: 0.85 mg/dL (ref 0.61–1.24)
GFR calc non Af Amer: >60 mL/min (ref >60)
Glucose, Bld: 151 mg/dL — ABNORMAL HIGH (ref 65–99)
POTASSIUM: 3.8 mmol/L (ref 3.5–5.1)
Sodium: 135 mmol/L (ref 135–145)

## 2015-06-20 LAB — CBC WITH DIFFERENTIAL/PLATELET
BASOS ABS: 0 10*3/uL (ref 0–0.1)
BASOS PCT: 1 %
Eosinophils Absolute: 0.1 10*3/uL (ref 0–0.7)
Eosinophils Relative: 2 %
HEMATOCRIT: 43.4 % (ref 40.0–52.0)
HEMOGLOBIN: 15 g/dL (ref 13.0–18.0)
LYMPHS PCT: 14 %
Lymphs Abs: 0.5 10*3/uL — ABNORMAL LOW (ref 1.0–3.6)
MCH: 31.1 pg (ref 26.0–34.0)
MCHC: 34.6 g/dL (ref 32.0–36.0)
MCV: 89.8 fL (ref 80.0–100.0)
Monocytes Absolute: 0.4 10*3/uL (ref 0.2–1.0)
Monocytes Relative: 13 %
NEUTROS ABS: 2.4 10*3/uL (ref 1.4–6.5)
NEUTROS PCT: 70 %
Platelets: 235 10*3/uL (ref 150–440)
RBC: 4.84 MIL/uL (ref 4.40–5.90)
RDW: 14.5 % (ref 11.5–14.5)
WBC: 3.4 10*3/uL — AB (ref 3.8–10.6)

## 2015-06-20 LAB — MAGNESIUM: MAGNESIUM: 1.9 mg/dL (ref 1.7–2.4)

## 2015-06-20 MED ORDER — DOCETAXEL CHEMO INJECTION 160 MG/16ML
65.0000 mg/m2 | Freq: Once | INTRAVENOUS | Status: DC
  Filled 2015-06-20: qty 13

## 2015-06-20 MED ORDER — DEXAMETHASONE SODIUM PHOSPHATE 100 MG/10ML IJ SOLN
Freq: Once | INTRAMUSCULAR | Status: AC
  Administered 2015-06-20: 13:00:00 via INTRAVENOUS
  Filled 2015-06-20: qty 8

## 2015-06-20 MED ORDER — POTASSIUM CHLORIDE 2 MEQ/ML IV SOLN
Freq: Once | INTRAVENOUS | Status: AC
  Administered 2015-06-20: 11:00:00 via INTRAVENOUS
  Filled 2015-06-20: qty 1000

## 2015-06-20 MED ORDER — SODIUM CHLORIDE 0.9 % IV SOLN: Freq: Once | INTRAVENOUS | Status: DC

## 2015-06-20 MED ORDER — SODIUM CHLORIDE 0.9 % IV SOLN
Freq: Once | INTRAVENOUS | Status: AC
  Administered 2015-06-20: 11:00:00 via INTRAVENOUS
  Filled 2015-06-20: qty 1000

## 2015-06-20 MED ORDER — PEGFILGRASTIM 6 MG/0.6ML ~~LOC~~ PSKT: 6.0000 mg | PREFILLED_SYRINGE | Freq: Once | SUBCUTANEOUS | Status: DC

## 2015-06-20 MED ORDER — ONDANSETRON HCL 8 MG PO TABS: 8.0000 mg | ORAL_TABLET | Freq: Two times a day (BID) | ORAL | Status: DC | PRN

## 2015-06-20 MED ORDER — SODIUM CHLORIDE 0.9 % IV SOLN
683.5000 mg | Freq: Once | INTRAVENOUS | Status: DC
  Filled 2015-06-20: qty 68

## 2015-06-20 MED ORDER — SODIUM CHLORIDE 0.9 % IV SOLN
Freq: Once | INTRAVENOUS | Status: AC
  Filled 2015-06-20: qty 8

## 2015-06-20 MED ORDER — PEGFILGRASTIM 6 MG/0.6ML ~~LOC~~ PSKT
6.0000 mg | PREFILLED_SYRINGE | Freq: Once | SUBCUTANEOUS | Status: AC
  Administered 2015-06-20: 6 mg via SUBCUTANEOUS
  Filled 2015-06-20: qty 0.6

## 2015-06-20 MED ORDER — SODIUM CHLORIDE 0.9 % IV SOLN
75.0000 mg/m2 | Freq: Once | INTRAVENOUS | Status: AC
  Administered 2015-06-20: 148 mg via INTRAVENOUS
  Filled 2015-06-20: qty 148

## 2015-06-20 MED ORDER — PROCHLORPERAZINE MALEATE 10 MG PO TABS: 10.0000 mg | ORAL_TABLET | Freq: Four times a day (QID) | ORAL | Status: DC | PRN

## 2015-06-20 MED ORDER — SODIUM CHLORIDE 0.9 % IV SOLN
Freq: Once | INTRAVENOUS | Status: DC
  Filled 2015-06-20: qty 1000

## 2015-06-20 MED ORDER — SODIUM CHLORIDE 0.9 % IV SOLN: 10.0000 mg | Freq: Once | INTRAVENOUS | Status: DC

## 2015-06-20 MED ORDER — DEXTROSE 5 % IV SOLN
75.0000 mg/m2 | Freq: Once | INTRAVENOUS | Status: AC
  Administered 2015-06-20: 150 mg via INTRAVENOUS
  Filled 2015-06-20: qty 15

## 2015-06-20 NOTE — Progress Notes (Signed)
Patient states he is not interested in getting a port to have chemo.  Thinks he only has two more treatments.  Wants to know if MD will change his mind on what type of chemo so that he does not have to get a port.
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
## 2015-06-27 ENCOUNTER — Inpatient Hospital Stay (HOSPITAL_BASED_OUTPATIENT_CLINIC_OR_DEPARTMENT_OTHER): Payer: BLUE CROSS/BLUE SHIELD | Admitting: Oncology

## 2015-06-27 ENCOUNTER — Inpatient Hospital Stay: Payer: BLUE CROSS/BLUE SHIELD

## 2015-06-27 VITALS — BP 120/82 | HR 89 | Temp 97.5°F | Resp 16 | Wt 175.3 lb

## 2015-06-27 DIAGNOSIS — B370 Candidal stomatitis: Secondary | ICD-10-CM

## 2015-06-27 DIAGNOSIS — Z923 Personal history of irradiation: Secondary | ICD-10-CM | POA: Diagnosis not present

## 2015-06-27 DIAGNOSIS — R05 Cough: Secondary | ICD-10-CM

## 2015-06-27 DIAGNOSIS — C770 Secondary and unspecified malignant neoplasm of lymph nodes of head, face and neck: Secondary | ICD-10-CM | POA: Diagnosis not present

## 2015-06-27 DIAGNOSIS — R531 Weakness: Secondary | ICD-10-CM

## 2015-06-27 DIAGNOSIS — R630 Anorexia: Secondary | ICD-10-CM

## 2015-06-27 DIAGNOSIS — R1319 Other dysphagia: Secondary | ICD-10-CM

## 2015-06-27 DIAGNOSIS — R110 Nausea: Secondary | ICD-10-CM | POA: Diagnosis not present

## 2015-06-27 DIAGNOSIS — C029 Malignant neoplasm of tongue, unspecified: Secondary | ICD-10-CM

## 2015-06-27 DIAGNOSIS — R197 Diarrhea, unspecified: Secondary | ICD-10-CM

## 2015-06-27 DIAGNOSIS — G479 Sleep disorder, unspecified: Secondary | ICD-10-CM

## 2015-06-27 DIAGNOSIS — R51 Headache: Secondary | ICD-10-CM

## 2015-06-27 LAB — CBC WITH DIFFERENTIAL/PLATELET
BASOS PCT: 0 %
Basophils Absolute: 0 10*3/uL (ref 0–0.1)
EOS ABS: 0.1 10*3/uL (ref 0–0.7)
Eosinophils Relative: 1 %
HCT: 41.8 % (ref 40.0–52.0)
HEMOGLOBIN: 14.3 g/dL (ref 13.0–18.0)
Lymphocytes Relative: 5 %
Lymphs Abs: 0.6 10*3/uL — ABNORMAL LOW (ref 1.0–3.6)
MCH: 30.9 pg (ref 26.0–34.0)
MCHC: 34.3 g/dL (ref 32.0–36.0)
MCV: 90 fL (ref 80.0–100.0)
MONOS PCT: 16 %
Monocytes Absolute: 2 10*3/uL — ABNORMAL HIGH (ref 0.2–1.0)
NEUTROS ABS: 9.7 10*3/uL — AB (ref 1.4–6.5)
NEUTROS PCT: 78 %
Platelets: 171 10*3/uL (ref 150–440)
RBC: 4.64 MIL/uL (ref 4.40–5.90)
RDW: 14.1 % (ref 11.5–14.5)
WBC: 12.5 10*3/uL — AB (ref 3.8–10.6)

## 2015-06-27 LAB — COMPREHENSIVE METABOLIC PANEL
ALBUMIN: 4 g/dL (ref 3.5–5.0)
ALK PHOS: 86 U/L (ref 38–126)
ALT: 21 U/L (ref 17–63)
ANION GAP: 7 (ref 5–15)
AST: 20 U/L (ref 15–41)
BILIRUBIN TOTAL: 0.6 mg/dL (ref 0.3–1.2)
BUN: 12 mg/dL (ref 6–20)
CALCIUM: 9 mg/dL (ref 8.9–10.3)
CO2: 28 mmol/L (ref 22–32)
CREATININE: 1.01 mg/dL (ref 0.61–1.24)
Chloride: 99 mmol/L — ABNORMAL LOW (ref 101–111)
GFR calc Af Amer: >60 mL/min (ref >60)
GFR calc non Af Amer: >60 mL/min (ref >60)
GLUCOSE: 108 mg/dL — AB (ref 65–99)
Potassium: 3.7 mmol/L (ref 3.5–5.1)
SODIUM: 134 mmol/L — AB (ref 135–145)
TOTAL PROTEIN: 6.5 g/dL (ref 6.5–8.1)

## 2015-06-27 MED ORDER — FIRST-DUKES MOUTHWASH MT SUSP: 5.0000 mL | Freq: Four times a day (QID) | OROMUCOSAL | Status: AC | PRN

## 2015-06-27 MED ORDER — FLUCONAZOLE 100 MG PO TABS: 100.0000 mg | ORAL_TABLET | Freq: Every day | ORAL | Status: DC

## 2015-06-27 MED ORDER — DEXAMETHASONE 4 MG PO TABS: 4.0000 mg | ORAL_TABLET | Freq: Every day | ORAL | Status: DC

## 2015-06-27 MED ORDER — LISINOPRIL 10 MG PO TABS: 10.0000 mg | ORAL_TABLET | Freq: Every day | ORAL | Status: DC

## 2015-06-27 NOTE — Progress Notes (Signed)
Patient has been having symptoms of nausea, change in taste which led to decrease in appetite, and the urge of having diarrhea but when he went to restroom he did not have to go.  Has been out of bp med for 3-4 days and having headaches.  He feels that the Dexamethasone was helping with his throat pain and cough.
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
## 2015-07-11 ENCOUNTER — Inpatient Hospital Stay: Payer: BLUE CROSS/BLUE SHIELD

## 2015-07-11 ENCOUNTER — Inpatient Hospital Stay (HOSPITAL_BASED_OUTPATIENT_CLINIC_OR_DEPARTMENT_OTHER): Payer: BLUE CROSS/BLUE SHIELD | Admitting: Oncology

## 2015-07-11 ENCOUNTER — Inpatient Hospital Stay: Payer: BLUE CROSS/BLUE SHIELD | Attending: Oncology

## 2015-07-11 VITALS — BP 125/82 | HR 79 | Temp 97.7°F | Resp 18 | Wt 182.5 lb

## 2015-07-11 VITALS — BP 119/75 | HR 68 | Resp 20

## 2015-07-11 DIAGNOSIS — C770 Secondary and unspecified malignant neoplasm of lymph nodes of head, face and neck: Secondary | ICD-10-CM | POA: Diagnosis not present

## 2015-07-11 DIAGNOSIS — Z5111 Encounter for antineoplastic chemotherapy: Secondary | ICD-10-CM | POA: Insufficient documentation

## 2015-07-11 DIAGNOSIS — I10 Essential (primary) hypertension: Secondary | ICD-10-CM | POA: Diagnosis not present

## 2015-07-11 DIAGNOSIS — C760 Malignant neoplasm of head, face and neck: Secondary | ICD-10-CM

## 2015-07-11 DIAGNOSIS — R5381 Other malaise: Secondary | ICD-10-CM

## 2015-07-11 DIAGNOSIS — Z79899 Other long term (current) drug therapy: Secondary | ICD-10-CM | POA: Insufficient documentation

## 2015-07-11 DIAGNOSIS — C01 Malignant neoplasm of base of tongue: Secondary | ICD-10-CM

## 2015-07-11 DIAGNOSIS — Z7689 Persons encountering health services in other specified circumstances: Secondary | ICD-10-CM | POA: Diagnosis not present

## 2015-07-11 DIAGNOSIS — C029 Malignant neoplasm of tongue, unspecified: Secondary | ICD-10-CM

## 2015-07-11 DIAGNOSIS — F418 Other specified anxiety disorders: Secondary | ICD-10-CM

## 2015-07-11 DIAGNOSIS — Z923 Personal history of irradiation: Secondary | ICD-10-CM | POA: Insufficient documentation

## 2015-07-11 DIAGNOSIS — E785 Hyperlipidemia, unspecified: Secondary | ICD-10-CM | POA: Diagnosis not present

## 2015-07-11 DIAGNOSIS — G4730 Sleep apnea, unspecified: Secondary | ICD-10-CM | POA: Diagnosis not present

## 2015-07-11 DIAGNOSIS — R5383 Other fatigue: Secondary | ICD-10-CM | POA: Insufficient documentation

## 2015-07-11 DIAGNOSIS — I429 Cardiomyopathy, unspecified: Secondary | ICD-10-CM | POA: Diagnosis not present

## 2015-07-11 LAB — COMPREHENSIVE METABOLIC PANEL
ALBUMIN: 3.4 g/dL — AB (ref 3.5–5.0)
ALT: 19 U/L (ref 17–63)
ANION GAP: 4 — AB (ref 5–15)
AST: 14 U/L — ABNORMAL LOW (ref 15–41)
Alkaline Phosphatase: 46 U/L (ref 38–126)
BUN: 18 mg/dL (ref 6–20)
CO2: 26 mmol/L (ref 22–32)
Calcium: 8.9 mg/dL (ref 8.9–10.3)
Chloride: 105 mmol/L (ref 101–111)
Creatinine, Ser: 0.8 mg/dL (ref 0.61–1.24)
GFR calc Af Amer: >60 mL/min (ref >60)
GFR calc non Af Amer: >60 mL/min (ref >60)
GLUCOSE: 128 mg/dL — AB (ref 65–99)
POTASSIUM: 4 mmol/L (ref 3.5–5.1)
SODIUM: 135 mmol/L (ref 135–145)
Total Bilirubin: 0.5 mg/dL (ref 0.3–1.2)
Total Protein: 5.7 g/dL — ABNORMAL LOW (ref 6.5–8.1)

## 2015-07-11 LAB — CBC WITH DIFFERENTIAL/PLATELET
BASOS PCT: 0 %
Basophils Absolute: 0 10*3/uL (ref 0–0.1)
EOS ABS: 0 10*3/uL (ref 0–0.7)
EOS PCT: 0 %
HCT: 39.1 % — ABNORMAL LOW (ref 40.0–52.0)
Hemoglobin: 13.7 g/dL (ref 13.0–18.0)
Lymphocytes Relative: 5 %
Lymphs Abs: 0.6 10*3/uL — ABNORMAL LOW (ref 1.0–3.6)
MCH: 31.6 pg (ref 26.0–34.0)
MCHC: 35 g/dL (ref 32.0–36.0)
MCV: 90.3 fL (ref 80.0–100.0)
MONO ABS: 1.3 10*3/uL — AB (ref 0.2–1.0)
MONOS PCT: 12 %
NEUTROS PCT: 83 %
Neutro Abs: 9.3 10*3/uL — ABNORMAL HIGH (ref 1.4–6.5)
PLATELETS: 345 10*3/uL (ref 150–440)
RBC: 4.33 MIL/uL — ABNORMAL LOW (ref 4.40–5.90)
RDW: 15.1 % — AB (ref 11.5–14.5)
WBC: 11.2 10*3/uL — ABNORMAL HIGH (ref 3.8–10.6)

## 2015-07-11 MED ORDER — PEGFILGRASTIM 6 MG/0.6ML ~~LOC~~ PSKT
6.0000 mg | PREFILLED_SYRINGE | Freq: Once | SUBCUTANEOUS | Status: AC
  Administered 2015-07-11: 6 mg via SUBCUTANEOUS
  Filled 2015-07-11: qty 0.6

## 2015-07-11 MED ORDER — SODIUM CHLORIDE 0.9 % IV SOLN
Freq: Once | INTRAVENOUS | Status: AC
  Administered 2015-07-11: 10:00:00 via INTRAVENOUS
  Filled 2015-07-11: qty 1000

## 2015-07-11 MED ORDER — PALONOSETRON HCL INJECTION 0.25 MG/5ML
0.2500 mg | Freq: Once | INTRAVENOUS | Status: AC
  Administered 2015-07-11: 0.25 mg via INTRAVENOUS
  Filled 2015-07-11: qty 5

## 2015-07-11 MED ORDER — CISPLATIN CHEMO INJECTION 100MG/100ML
75.0000 mg/m2 | Freq: Once | INTRAVENOUS | Status: AC
  Administered 2015-07-11: 148 mg via INTRAVENOUS
  Filled 2015-07-11: qty 148

## 2015-07-11 MED ORDER — SODIUM CHLORIDE 0.9 % IV SOLN
Freq: Once | INTRAVENOUS | Status: AC
  Administered 2015-07-11: 13:00:00 via INTRAVENOUS
  Filled 2015-07-11: qty 5

## 2015-07-11 MED ORDER — POTASSIUM CHLORIDE 2 MEQ/ML IV SOLN
Freq: Once | INTRAVENOUS | Status: AC
  Administered 2015-07-11: 11:00:00 via INTRAVENOUS
  Filled 2015-07-11: qty 1000

## 2015-07-11 MED ORDER — DEXTROSE 5 % IV SOLN
75.0000 mg/m2 | Freq: Once | INTRAVENOUS | Status: AC
  Administered 2015-07-11: 150 mg via INTRAVENOUS
  Filled 2015-07-11: qty 15
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
## 2015-07-11 NOTE — Progress Notes (Signed)
Patient was having diarrhea with abdominal cramping and loss of appetite that lt 4 lasted about 4 days after last treatment.

## 2015-08-01 ENCOUNTER — Inpatient Hospital Stay (HOSPITAL_BASED_OUTPATIENT_CLINIC_OR_DEPARTMENT_OTHER): Payer: BLUE CROSS/BLUE SHIELD | Admitting: Oncology

## 2015-08-01 ENCOUNTER — Inpatient Hospital Stay: Payer: BLUE CROSS/BLUE SHIELD

## 2015-08-01 ENCOUNTER — Inpatient Hospital Stay: Payer: BLUE CROSS/BLUE SHIELD | Attending: Oncology

## 2015-08-01 VITALS — BP 129/85 | HR 74 | Temp 97.8°F | Resp 16 | Wt 180.6 lb

## 2015-08-01 DIAGNOSIS — G4700 Insomnia, unspecified: Secondary | ICD-10-CM | POA: Insufficient documentation

## 2015-08-01 DIAGNOSIS — Z923 Personal history of irradiation: Secondary | ICD-10-CM | POA: Insufficient documentation

## 2015-08-01 DIAGNOSIS — R5381 Other malaise: Secondary | ICD-10-CM

## 2015-08-01 DIAGNOSIS — I429 Cardiomyopathy, unspecified: Secondary | ICD-10-CM | POA: Insufficient documentation

## 2015-08-01 DIAGNOSIS — C029 Malignant neoplasm of tongue, unspecified: Secondary | ICD-10-CM

## 2015-08-01 DIAGNOSIS — R5383 Other fatigue: Secondary | ICD-10-CM | POA: Insufficient documentation

## 2015-08-01 DIAGNOSIS — Z79899 Other long term (current) drug therapy: Secondary | ICD-10-CM | POA: Diagnosis not present

## 2015-08-01 DIAGNOSIS — I2510 Atherosclerotic heart disease of native coronary artery without angina pectoris: Secondary | ICD-10-CM | POA: Insufficient documentation

## 2015-08-01 DIAGNOSIS — E876 Hypokalemia: Secondary | ICD-10-CM | POA: Insufficient documentation

## 2015-08-01 DIAGNOSIS — Z5111 Encounter for antineoplastic chemotherapy: Secondary | ICD-10-CM | POA: Diagnosis not present

## 2015-08-01 DIAGNOSIS — I10 Essential (primary) hypertension: Secondary | ICD-10-CM | POA: Insufficient documentation

## 2015-08-01 DIAGNOSIS — C01 Malignant neoplasm of base of tongue: Secondary | ICD-10-CM | POA: Diagnosis not present

## 2015-08-01 DIAGNOSIS — C770 Secondary and unspecified malignant neoplasm of lymph nodes of head, face and neck: Secondary | ICD-10-CM

## 2015-08-01 DIAGNOSIS — R21 Rash and other nonspecific skin eruption: Secondary | ICD-10-CM

## 2015-08-01 DIAGNOSIS — G4730 Sleep apnea, unspecified: Secondary | ICD-10-CM | POA: Diagnosis not present

## 2015-08-01 DIAGNOSIS — I38 Endocarditis, valve unspecified: Secondary | ICD-10-CM | POA: Insufficient documentation

## 2015-08-01 DIAGNOSIS — L539 Erythematous condition, unspecified: Secondary | ICD-10-CM | POA: Insufficient documentation

## 2015-08-01 DIAGNOSIS — Z800 Family history of malignant neoplasm of digestive organs: Secondary | ICD-10-CM | POA: Diagnosis not present

## 2015-08-01 DIAGNOSIS — E785 Hyperlipidemia, unspecified: Secondary | ICD-10-CM | POA: Insufficient documentation

## 2015-08-01 DIAGNOSIS — F329 Major depressive disorder, single episode, unspecified: Secondary | ICD-10-CM | POA: Insufficient documentation

## 2015-08-01 DIAGNOSIS — Z7689 Persons encountering health services in other specified circumstances: Secondary | ICD-10-CM | POA: Diagnosis not present

## 2015-08-01 LAB — COMPREHENSIVE METABOLIC PANEL
ALBUMIN: 3.6 g/dL (ref 3.5–5.0)
ALK PHOS: 53 U/L (ref 38–126)
ALT: 21 U/L (ref 17–63)
ANION GAP: 4 — AB (ref 5–15)
AST: 14 U/L — AB (ref 15–41)
BILIRUBIN TOTAL: 0.5 mg/dL (ref 0.3–1.2)
BUN: 27 mg/dL — AB (ref 6–20)
CALCIUM: 8.5 mg/dL — AB (ref 8.9–10.3)
CO2: 28 mmol/L (ref 22–32)
Chloride: 98 mmol/L — ABNORMAL LOW (ref 101–111)
Creatinine, Ser: 0.92 mg/dL (ref 0.61–1.24)
GFR calc Af Amer: >60 mL/min (ref >60)
Glucose, Bld: 94 mg/dL (ref 65–99)
Potassium: 3.4 mmol/L — ABNORMAL LOW (ref 3.5–5.1)
Sodium: 130 mmol/L — ABNORMAL LOW (ref 135–145)
TOTAL PROTEIN: 5.9 g/dL — AB (ref 6.5–8.1)

## 2015-08-01 LAB — CBC WITH DIFFERENTIAL/PLATELET
Basophils Absolute: 0 10*3/uL (ref 0–0.1)
Basophils Relative: 0 %
Eosinophils Absolute: 0 10*3/uL (ref 0–0.7)
Eosinophils Relative: 0 %
HEMATOCRIT: 40.3 % (ref 40.0–52.0)
HEMOGLOBIN: 13.9 g/dL (ref 13.0–18.0)
LYMPHS ABS: 0.8 10*3/uL — AB (ref 1.0–3.6)
LYMPHS PCT: 7 %
MCH: 32.1 pg (ref 26.0–34.0)
MCHC: 34.5 g/dL (ref 32.0–36.0)
MCV: 93.1 fL (ref 80.0–100.0)
MONO ABS: 1.7 10*3/uL — AB (ref 0.2–1.0)
MONOS PCT: 15 %
NEUTROS ABS: 8.9 10*3/uL — AB (ref 1.4–6.5)
NEUTROS PCT: 78 %
Platelets: 273 10*3/uL (ref 150–440)
RBC: 4.32 MIL/uL — ABNORMAL LOW (ref 4.40–5.90)
RDW: 16.1 % — ABNORMAL HIGH (ref 11.5–14.5)
WBC: 11.4 10*3/uL — ABNORMAL HIGH (ref 3.8–10.6)

## 2015-08-01 MED ORDER — DOCETAXEL CHEMO INJECTION 160 MG/16ML
75.0000 mg/m2 | Freq: Once | INTRAVENOUS | Status: AC
  Administered 2015-08-01: 150 mg via INTRAVENOUS
  Filled 2015-08-01: qty 15

## 2015-08-01 MED ORDER — POTASSIUM CHLORIDE 2 MEQ/ML IV SOLN
Freq: Once | INTRAVENOUS | Status: AC
  Administered 2015-08-01: 10:00:00 via INTRAVENOUS
  Filled 2015-08-01: qty 1000

## 2015-08-01 MED ORDER — PALONOSETRON HCL INJECTION 0.25 MG/5ML
0.2500 mg | Freq: Once | INTRAVENOUS | Status: AC
  Administered 2015-08-01: 0.25 mg via INTRAVENOUS
  Filled 2015-08-01: qty 5

## 2015-08-01 MED ORDER — SODIUM CHLORIDE 0.9 % IV SOLN
Freq: Once | INTRAVENOUS | Status: AC
  Administered 2015-08-01: 12:00:00 via INTRAVENOUS
  Filled 2015-08-01: qty 5

## 2015-08-01 MED ORDER — PEGFILGRASTIM 6 MG/0.6ML ~~LOC~~ PSKT
6.0000 mg | PREFILLED_SYRINGE | Freq: Once | SUBCUTANEOUS | Status: AC
  Administered 2015-08-01: 6 mg via SUBCUTANEOUS
  Filled 2015-08-01: qty 0.6

## 2015-08-01 MED ORDER — CISPLATIN CHEMO INJECTION 100MG/100ML
75.0000 mg/m2 | Freq: Once | INTRAVENOUS | Status: AC
  Administered 2015-08-01: 148 mg via INTRAVENOUS
  Filled 2015-08-01: qty 148

## 2015-08-01 MED ORDER — SODIUM CHLORIDE 0.9 % IV SOLN
Freq: Once | INTRAVENOUS | Status: AC
  Administered 2015-08-01: 10:00:00 via INTRAVENOUS
  Filled 2015-08-01: qty 1000

## 2015-08-01 NOTE — Progress Notes (Signed)
After last treatment patient was having an increase in fatigue with nausea and constipation.  He is feeling back to baseline today and does have medication at home to take for constipation if needed.
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
## 2015-08-30 ENCOUNTER — Other Ambulatory Visit: Payer: Self-pay

## 2015-08-30 MED ORDER — LISINOPRIL 10 MG PO TABS: 10.0000 mg | ORAL_TABLET | Freq: Every day | ORAL | Status: AC

## 2015-09-14 ENCOUNTER — Ambulatory Visit
Admission: RE | Admit: 2015-09-14 | Discharge: 2015-09-14 | Disposition: A | Discharge status: Home / Self Care | Payer: BLUE CROSS/BLUE SHIELD | Source: Ambulatory Visit | Attending: Radiation Oncology | Admitting: Radiation Oncology

## 2015-09-14 ENCOUNTER — Other Ambulatory Visit: Payer: Self-pay | Admitting: *Deleted

## 2015-09-14 ENCOUNTER — Inpatient Hospital Stay: Payer: BLUE CROSS/BLUE SHIELD | Attending: Oncology | Admitting: Oncology

## 2015-09-14 ENCOUNTER — Encounter: Payer: Self-pay | Admitting: Radiation Oncology

## 2015-09-14 VITALS — BP 144/96 | HR 103 | Temp 97.5°F | Resp 20 | Wt 188.2 lb

## 2015-09-14 VITALS — BP 133/87 | HR 98 | Temp 98.2°F | Resp 18

## 2015-09-14 DIAGNOSIS — Z79899 Other long term (current) drug therapy: Secondary | ICD-10-CM | POA: Diagnosis not present

## 2015-09-14 DIAGNOSIS — R062 Wheezing: Secondary | ICD-10-CM | POA: Diagnosis not present

## 2015-09-14 DIAGNOSIS — C01 Malignant neoplasm of base of tongue: Secondary | ICD-10-CM | POA: Diagnosis not present

## 2015-09-14 DIAGNOSIS — G4700 Insomnia, unspecified: Secondary | ICD-10-CM | POA: Diagnosis not present

## 2015-09-14 DIAGNOSIS — I429 Cardiomyopathy, unspecified: Secondary | ICD-10-CM | POA: Diagnosis not present

## 2015-09-14 DIAGNOSIS — E876 Hypokalemia: Secondary | ICD-10-CM | POA: Insufficient documentation

## 2015-09-14 DIAGNOSIS — G4730 Sleep apnea, unspecified: Secondary | ICD-10-CM | POA: Insufficient documentation

## 2015-09-14 DIAGNOSIS — I10 Essential (primary) hypertension: Secondary | ICD-10-CM | POA: Insufficient documentation

## 2015-09-14 DIAGNOSIS — E785 Hyperlipidemia, unspecified: Secondary | ICD-10-CM | POA: Diagnosis not present

## 2015-09-14 DIAGNOSIS — R21 Rash and other nonspecific skin eruption: Secondary | ICD-10-CM | POA: Diagnosis not present

## 2015-09-14 DIAGNOSIS — C760 Malignant neoplasm of head, face and neck: Secondary | ICD-10-CM

## 2015-09-14 DIAGNOSIS — F418 Other specified anxiety disorders: Secondary | ICD-10-CM | POA: Diagnosis not present

## 2015-09-14 DIAGNOSIS — C029 Malignant neoplasm of tongue, unspecified: Secondary | ICD-10-CM

## 2015-09-14 DIAGNOSIS — M7989 Other specified soft tissue disorders: Secondary | ICD-10-CM

## 2015-09-14 DIAGNOSIS — L539 Erythematous condition, unspecified: Secondary | ICD-10-CM

## 2015-09-14 DIAGNOSIS — Z800 Family history of malignant neoplasm of digestive organs: Secondary | ICD-10-CM | POA: Diagnosis not present

## 2015-09-14 DIAGNOSIS — R590 Localized enlarged lymph nodes: Secondary | ICD-10-CM | POA: Insufficient documentation

## 2015-09-14 NOTE — Progress Notes (Signed)
Radiation Oncology Follow up Note  Name: Gavin Barnes   Date:   09/14/2015 MRN:  [id number] DOB: [birth year]    This 60 y.o. male presents to the clinic today for follow-up for squamous cell carcinoma of the oral tongue stage. IVa (T2 N2 B M0) now out 6 months concurrent therapy  REFERRING PROVIDER: Lloyd Huger, MD  HPI: Patient is a 60 year old male now out 6 months having completed combined modality therapy for stage IVa squamous cell carcinoma of the right lateral tongue. He is seen today in routine follow-up and is doing fairly well. His mother's recent PET CT scan performed in January 2017 showed persistent but the creased hypermetabolic activity in the region of the right tongue. He had resolution of right cervical hypermetabolic activity in the lymph nodes. Based on this is been treated with cisplatin and Taxotere by medical oncology. He specifically denies dysphagia or tongue pain.   COMPLICATIONS OF TREATMENT: none  FOLLOW UP COMPLIANCE: keeps appointments   PHYSICAL EXAM:  BP 144/96 mmHg  Pulse 103  Temp(Src) 97.5 F (36.4 C)  Resp 20  Wt 188 lb 2.6 oz (85.35 kg) Oral cavity shows some tethering of the right tongue with slight slight nodularity present. No discernible lesion is visible. Indirect mirror examination is difficult Secker to trismus. Neck is clear without evidence of subject gastric cervical or supraclavicular adenopathy. Well-developed well-nourished patient in NAD. HEENT reveals PERLA, EOMI, discs not visualized.  Oral cavity is clear. No oral mucosal lesions are identified. Neck is clear without evidence of cervical or supraclavicular adenopathy. Lungs are clear to A&P. Cardiac examination is essentially unremarkable with regular rate and rhythm without murmur rub or thrill. Abdomen is benign with no organomegaly or masses noted. Motor sensory and DTR levels are equal and symmetric in the upper and lower extremities. Cranial nerves II through XII are grossly  intact. Proprioception is intact. No peripheral adenopathy or edema is identified. No motor or sensory levels are noted. Crude visual fields are within normal range.  RADIOLOGY RESULTS: PET scan is reviewed  PLAN: Present time patient continues under the care of medical oncology. I am please was overall progress at this point. I have asked to see him back in 6 months for follow-up. Should this disease progress could always do a salvage course of radiation therapy if indicated. Will discuss follow-up care with medical oncology.  I would like to take this opportunity for allowing me to participate in the care of your patient.Armstead Peaks., MD

## 2015-09-14 NOTE — Progress Notes (Signed)
Patient has good and bad days with the bad days consisting of SOBr and no energy.  Is going to go for chest X-ray ordered by Dr. Baruch Gouty for the SOBr.  His fingernails are discolored with no pain and still has difficulty swallowing certain pills.  Would also like to discuss his neck and facial swelling.
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
## 2015-10-12 ENCOUNTER — Encounter: Payer: Self-pay | Admitting: *Deleted

## 2015-10-12 NOTE — Discharge Instructions (Signed)

## 2015-10-13 ENCOUNTER — Ambulatory Visit: Payer: BLUE CROSS/BLUE SHIELD | Admitting: Anesthesiology

## 2015-10-13 ENCOUNTER — Encounter
Admission: RE | Disposition: A | Discharge status: Home / Self Care | Payer: Self-pay | Source: Ambulatory Visit | Attending: Otolaryngology

## 2015-10-13 ENCOUNTER — Ambulatory Visit
Admission: RE | Admit: 2015-10-13 | Discharge: 2015-10-13 | Disposition: A | Discharge status: Home / Self Care | Payer: BLUE CROSS/BLUE SHIELD | Source: Ambulatory Visit | Attending: Otolaryngology | Admitting: Otolaryngology

## 2015-10-13 DIAGNOSIS — K1370 Unspecified lesions of oral mucosa: Secondary | ICD-10-CM | POA: Diagnosis present

## 2015-10-13 DIAGNOSIS — I10 Essential (primary) hypertension: Secondary | ICD-10-CM | POA: Diagnosis not present

## 2015-10-13 DIAGNOSIS — G4730 Sleep apnea, unspecified: Secondary | ICD-10-CM | POA: Insufficient documentation

## 2015-10-13 DIAGNOSIS — M1990 Unspecified osteoarthritis, unspecified site: Secondary | ICD-10-CM | POA: Insufficient documentation

## 2015-10-13 DIAGNOSIS — I2510 Atherosclerotic heart disease of native coronary artery without angina pectoris: Secondary | ICD-10-CM | POA: Insufficient documentation

## 2015-10-13 DIAGNOSIS — C020 Malignant neoplasm of dorsal surface of tongue: Secondary | ICD-10-CM | POA: Diagnosis not present

## 2015-10-13 DIAGNOSIS — Z79899 Other long term (current) drug therapy: Secondary | ICD-10-CM | POA: Insufficient documentation

## 2015-10-13 HISTORY — DX: Unspecified osteoarthritis, unspecified site: M19.90

## 2015-10-13 HISTORY — DX: Reserved for inherently not codable concepts without codable children: IMO0001

## 2015-10-13 HISTORY — PX: DIRECT LARYNGOSCOPY: SHX5326

## 2015-10-13 SURGERY — LARYNGOSCOPY, DIRECT
Anesthesia: General | Site: Throat | Wound class: Clean Contaminated

## 2015-10-13 MED ORDER — SUCCINYLCHOLINE CHLORIDE 20 MG/ML IJ SOLN
INTRAMUSCULAR | Status: DC | PRN
  Administered 2015-10-13: 100 mg via INTRAVENOUS

## 2015-10-13 MED ORDER — FENTANYL CITRATE (PF) 100 MCG/2ML IJ SOLN
INTRAMUSCULAR | Status: DC | PRN
  Administered 2015-10-13: 100 ug via INTRAVENOUS

## 2015-10-13 MED ORDER — MIDAZOLAM HCL 5 MG/5ML IJ SOLN
INTRAMUSCULAR | Status: DC | PRN
  Administered 2015-10-13: 2 mg via INTRAVENOUS

## 2015-10-13 MED ORDER — SILVER NITRATE-POT NITRATE 75-25 % EX MISC
CUTANEOUS | Status: DC | PRN
  Administered 2015-10-13: 2 via TOPICAL

## 2015-10-13 MED ORDER — PROPOFOL 10 MG/ML IV BOLUS
INTRAVENOUS | Status: DC | PRN
  Administered 2015-10-13: 50 mg via INTRAVENOUS
  Administered 2015-10-13: 100 mg via INTRAVENOUS
  Administered 2015-10-13: 150 mg via INTRAVENOUS

## 2015-10-13 MED ORDER — OXYCODONE HCL 5 MG/5ML PO SOLN: 5.0000 mg | Freq: Once | ORAL | Status: DC | PRN

## 2015-10-13 MED ORDER — LACTATED RINGERS IV SOLN
INTRAVENOUS | Status: DC
  Administered 2015-10-13: 08:00:00 via INTRAVENOUS

## 2015-10-13 MED ORDER — ACETAMINOPHEN 325 MG PO TABS: 325.0000 mg | ORAL_TABLET | ORAL | Status: DC | PRN

## 2015-10-13 MED ORDER — LIDOCAINE HCL (CARDIAC) 20 MG/ML IV SOLN
INTRAVENOUS | Status: DC | PRN
  Administered 2015-10-13: 30 mg via INTRAVENOUS

## 2015-10-13 MED ORDER — ONDANSETRON HCL 4 MG/2ML IJ SOLN
4.0000 mg | Freq: Once | INTRAMUSCULAR | Status: AC | PRN
  Administered 2015-10-13: 4 mg via INTRAVENOUS

## 2015-10-13 MED ORDER — ACETAMINOPHEN 160 MG/5ML PO SOLN: 325.0000 mg | ORAL | Status: DC | PRN

## 2015-10-13 MED ORDER — DEXAMETHASONE SODIUM PHOSPHATE 4 MG/ML IJ SOLN
INTRAMUSCULAR | Status: DC | PRN
  Administered 2015-10-13: 10 mg via INTRAVENOUS

## 2015-10-13 MED ORDER — OXYCODONE HCL 5 MG PO TABS: 5.0000 mg | ORAL_TABLET | Freq: Once | ORAL | Status: DC | PRN

## 2015-10-13 MED ORDER — GLYCOPYRROLATE 0.2 MG/ML IJ SOLN
INTRAMUSCULAR | Status: DC | PRN
  Administered 2015-10-13: 0.2 mg via INTRAVENOUS

## 2015-10-13 SURGICAL SUPPLY — 22 items
BASIN GRAD PLASTIC 32OZ STRL (MISCELLANEOUS) ×3 IMPLANT
BLOCK BITE GUARD (MISCELLANEOUS) ×3 IMPLANT
COVER MAYO STAND STRL (DRAPES) IMPLANT
COVER TABLE BACK 60X90 (DRAPES) ×3 IMPLANT
CUP MEDICINE 2OZ PLAST GRAD ST (MISCELLANEOUS) ×3 IMPLANT
DRAPE SHEET LG 3/4 BI-LAMINATE (DRAPES) ×3 IMPLANT
DRESSING TELFA 4X3 1S ST N-ADH (GAUZE/BANDAGES/DRESSINGS) ×3 IMPLANT
GLOVE PI ULTRA LF STRL 7.5 (GLOVE) ×2 IMPLANT
GLOVE PI ULTRA NON LATEX 7.5 (GLOVE) ×4
KIT ROOM TURNOVER OR (KITS) ×3 IMPLANT
MARKER SKIN DUAL TIP RULER LAB (MISCELLANEOUS) ×3 IMPLANT
NEEDLE 18GX1X1/2 (RX/OR ONLY) (NEEDLE) IMPLANT
NEEDLE FILTER BLUNT 18X 1/2SAF (NEEDLE) ×2
NEEDLE FILTER BLUNT 18X1 1/2 (NEEDLE) ×1 IMPLANT
NS IRRIG 500ML POUR BTL (IV SOLUTION) ×3 IMPLANT
PATTIES SURGICAL .5 X.5 (GAUZE/BANDAGES/DRESSINGS) ×3 IMPLANT
SPONGE XRAY 4X4 16PLY STRL (MISCELLANEOUS) ×3 IMPLANT
STRAP BODY AND KNEE 60X3 (MISCELLANEOUS) ×3 IMPLANT
SYRINGE 10CC LL (SYRINGE) IMPLANT
TOWEL OR 17X26 4PK STRL BLUE (TOWEL DISPOSABLE) ×3 IMPLANT
TUBING CONN 6MMX3.1M (TUBING) ×2
TUBING SUCTION CONN 0.25 STRL (TUBING) ×1 IMPLANT

## 2015-10-13 NOTE — Op Note (Signed)
10/13/2015  9:14 AM    Gavin Barnes  [id number]   Pre-Op Dx:  Dorsal midline tongue lesion, suspect recurrent tongue cancer  Post-op Dx: Same  Proc: Direct laryngoscopy and biopsy of dorsal midline tongue lesion   Surg:  [name]  Anes:  GOT  EBL:  10 mL  Comp:  None  Findings:  A 3 cm firm mass in the dorsal midline tongue more posteriorly. Appears to have some areas of necrotic tissue starting in this.  Procedure: The patient was in a supine position and given general anesthesia by oral endotracheal intubation. There was a fairly difficult intubation using a glide scope because the size the tongue and it could not be moved easily. Once anesthesia was completed a Hollinger anterior laryngoscope was used for visualizing the hypopharynx. The epiglottis was small and omega shaped but not inflamed. The vocal cords were pearly white did not show any signs of lesions. The hypopharynx did not show any signs of lesions well.  Direct visualization the tongue and with endoscopes using high-power magnification revealed a firm mass in the middle of the dorsal surface of the tongue approximately 3 cm in diameter and going down into the thickness of the midportion of the posterior tongue. There is some yellowish areas under the mucosa that looked like possible necrotic tissue. Part of the right lateral tongue was missing from previous of cancer. He had chemotherapy and radiation to this area and the mucosa here was relatively soft. A medium-sized cup biting forcep was used for taking biopsies from the midportion of the tongue. As soon as I went through the mucosa and there was very granular tissue underneath this. Multiple biopsies were taken and then bleeding was controlled with direct pressure and silver nitrate cautery were I left a divot in the dorsal surface of the tongue mass.  The patient was awakened taken to the recovery room in satisfactory condition. There were no operative  complications.  Dispo:   To PACU to be discharged home  Plan:  To follow-up in the office on Tuesday to go over the path report and discuss options for treatment.  Please send a copy of this to Dr. Alyssa Grove and Dr. Burnett Harry H  10/13/2015 9:14 AM

## 2015-10-13 NOTE — H&P (Signed)
  H&P has been reviewed and no changes necessary. To be downloaded later. 

## 2015-10-13 NOTE — Anesthesia Procedure Notes (Signed)
Procedure Name: Intubation Date/Time: 10/13/2015 8:47 AM Performed by: Londell Moh Pre-anesthesia Checklist: Patient identified, Emergency Drugs available, Suction available, Patient being monitored and Timeout performed Patient Re-evaluated:Patient Re-evaluated prior to inductionOxygen Delivery Method: Circle system utilized Preoxygenation: Pre-oxygenation with 100% oxygen Intubation Type: IV induction Ventilation: Mask ventilation without difficulty Laryngoscope Size: Glidescope and 3 Grade View: Grade I Tube type: Oral Tube size: 7.0 mm Number of attempts: 1 Airway Equipment and Method: Video-laryngoscopy,  LTA kit utilized and Rigid stylet Placement Confirmation: ETT inserted through vocal cords under direct vision,  positive ETCO2 and breath sounds checked- equal and bilateral Secured at: 22 cm Tube secured with: Tape Dental Injury: Teeth and Oropharynx as per pre-operative assessment  Difficulty Due To: Difficulty was anticipated, Difficult Airway- due to anterior larynx, Difficult Airway- due to limited oral opening, Difficult Airway-  due to edematous airway and Difficult Airway- due to large tongue

## 2015-10-13 NOTE — Anesthesia Preprocedure Evaluation (Addendum)
Anesthesia Evaluation  Patient identified by MRN, date of birth, ID band Patient awake    Reviewed: Allergy & Precautions, H&P , NPO status , Patient's Chart, lab work & pertinent test results  Airway Mallampati: III  TM Distance: >3 FB Neck ROM: full    Dental no notable dental hx.    Pulmonary shortness of breath, sleep apnea ,    Pulmonary exam normal        Cardiovascular hypertension, + CAD   Rhythm:regular Rate:Normal     Neuro/Psych    GI/Hepatic   Endo/Other    Renal/GU      Musculoskeletal  (+) Arthritis ,   Abdominal   Peds  Hematology   Anesthesia Other Findings   Reproductive/Obstetrics                            Anesthesia Physical  Anesthesia Plan  ASA: III  Anesthesia Plan: General ETT   Post-op Pain Management:    Induction: Intravenous  Airway Management Planned:   Additional Equipment:   Intra-op Plan:   Post-operative Plan:   Informed Consent: I have reviewed the patients History and Physical, chart, labs and discussed the procedure including the risks, benefits and alternatives for the proposed anesthesia with the patient or authorized representative who has indicated his/her understanding and acceptance.     Plan Discussed with: CRNA  Anesthesia Plan Comments:        Anesthesia Quick Evaluation

## 2015-10-13 NOTE — Anesthesia Postprocedure Evaluation (Signed)
Anesthesia Post Note  Patient: Gavin Barnes  Procedure(s) Performed: Procedure(s) (LRB): DIRECT LARYNGOSCOPY WITH BIOPSY OF DORSAL TONGUE MIDLINE (N/A)  Patient location during evaluation: PACU Anesthesia Type: General Level of consciousness: awake and alert Pain management: pain level controlled Vital Signs Assessment: post-procedure vital signs reviewed and stable Respiratory status: spontaneous breathing, nonlabored ventilation, respiratory function stable and patient connected to nasal cannula oxygen Cardiovascular status: blood pressure returned to baseline and stable Postop Assessment: no signs of nausea or vomiting Anesthetic complications: no    Amaryllis Dyke

## 2015-10-13 NOTE — Transfer of Care (Signed)
Immediate Anesthesia Transfer of Care Note  Patient: Gavin Barnes  Procedure(s) Performed: Procedure(s) with comments: DIRECT LARYNGOSCOPY WITH BIOPSY OF DORSAL TONGUE MIDLINE (N/A) - MIDLINE  Patient Location: PACU  Anesthesia Type: General ETT  Level of Consciousness: awake, alert  and patient cooperative  Airway and Oxygen Therapy: Patient Spontanous Breathing and Patient connected to supplemental oxygen  Post-op Assessment: Post-op Vital signs reviewed, Patient's Cardiovascular Status Stable, Respiratory Function Stable, Patent Airway and No signs of Nausea or vomiting  Post-op Vital Signs: Reviewed and stable  Complications: No apparent anesthesia complications

## 2015-10-14 ENCOUNTER — Encounter: Payer: Self-pay | Admitting: Otolaryngology

## 2015-10-17 LAB — SURGICAL PATHOLOGY

## 2015-10-21 ENCOUNTER — Telehealth: Payer: Self-pay | Admitting: *Deleted

## 2015-10-21 ENCOUNTER — Ambulatory Visit
Admission: RE | Admit: 2015-10-21 | Discharge: 2015-10-21 | Disposition: A | Discharge status: Home / Self Care | Payer: BLUE CROSS/BLUE SHIELD | Source: Ambulatory Visit | Attending: Oncology | Admitting: Oncology

## 2015-10-21 DIAGNOSIS — R918 Other nonspecific abnormal finding of lung field: Secondary | ICD-10-CM | POA: Diagnosis not present

## 2015-10-21 DIAGNOSIS — C029 Malignant neoplasm of tongue, unspecified: Secondary | ICD-10-CM | POA: Diagnosis not present

## 2015-10-21 DIAGNOSIS — J90 Pleural effusion, not elsewhere classified: Secondary | ICD-10-CM | POA: Insufficient documentation

## 2015-10-21 DIAGNOSIS — R590 Localized enlarged lymph nodes: Secondary | ICD-10-CM | POA: Diagnosis not present

## 2015-10-21 LAB — GLUCOSE, CAPILLARY: GLUCOSE-CAPILLARY: 90 mg/dL (ref 65–99)

## 2015-10-21 MED ORDER — FLUDEOXYGLUCOSE F - 18 (FDG) INJECTION
12.9700 | Freq: Once | INTRAVENOUS | Status: AC | PRN
  Administered 2015-10-21: 12.97 via INTRAVENOUS

## 2015-10-21 NOTE — Telephone Encounter (Signed)
Patient notified of appointment scheduled for 5/31 @ 11:30 to see Dr. Grayland Ormond for results. Patient verbalized understanding and agreed to appointment time.

## 2015-10-26 ENCOUNTER — Inpatient Hospital Stay: Payer: BLUE CROSS/BLUE SHIELD | Attending: Oncology | Admitting: Oncology

## 2015-10-26 ENCOUNTER — Ambulatory Visit: Payer: BLUE CROSS/BLUE SHIELD | Admitting: Oncology

## 2015-10-26 VITALS — BP 116/84 | HR 101 | Temp 98.1°F | Resp 16 | Wt 175.9 lb

## 2015-10-26 DIAGNOSIS — C760 Malignant neoplasm of head, face and neck: Secondary | ICD-10-CM

## 2015-10-26 DIAGNOSIS — Z800 Family history of malignant neoplasm of digestive organs: Secondary | ICD-10-CM | POA: Insufficient documentation

## 2015-10-26 DIAGNOSIS — I10 Essential (primary) hypertension: Secondary | ICD-10-CM | POA: Diagnosis not present

## 2015-10-26 DIAGNOSIS — F418 Other specified anxiety disorders: Secondary | ICD-10-CM | POA: Diagnosis not present

## 2015-10-26 DIAGNOSIS — C01 Malignant neoplasm of base of tongue: Secondary | ICD-10-CM | POA: Insufficient documentation

## 2015-10-26 DIAGNOSIS — M1990 Unspecified osteoarthritis, unspecified site: Secondary | ICD-10-CM | POA: Diagnosis not present

## 2015-10-26 DIAGNOSIS — R0600 Dyspnea, unspecified: Secondary | ICD-10-CM | POA: Insufficient documentation

## 2015-10-26 DIAGNOSIS — R4702 Dysphasia: Secondary | ICD-10-CM | POA: Insufficient documentation

## 2015-10-26 DIAGNOSIS — E785 Hyperlipidemia, unspecified: Secondary | ICD-10-CM | POA: Diagnosis not present

## 2015-10-26 DIAGNOSIS — I429 Cardiomyopathy, unspecified: Secondary | ICD-10-CM | POA: Insufficient documentation

## 2015-10-26 DIAGNOSIS — C7989 Secondary malignant neoplasm of other specified sites: Secondary | ICD-10-CM | POA: Insufficient documentation

## 2015-10-26 DIAGNOSIS — G4730 Sleep apnea, unspecified: Secondary | ICD-10-CM | POA: Diagnosis not present

## 2015-10-26 DIAGNOSIS — Z79899 Other long term (current) drug therapy: Secondary | ICD-10-CM

## 2015-10-26 DIAGNOSIS — G4700 Insomnia, unspecified: Secondary | ICD-10-CM | POA: Insufficient documentation

## 2015-10-26 NOTE — Progress Notes (Signed)
Patient is here to discuss results.  He just returned from [facility] discussing his surgeon options.

## 2015-11-02 ENCOUNTER — Ambulatory Visit: Payer: BLUE CROSS/BLUE SHIELD
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
## 2015-11-03 ENCOUNTER — Inpatient Hospital Stay: Payer: BLUE CROSS/BLUE SHIELD | Admitting: Oncology

## 2015-11-03 ENCOUNTER — Telehealth: Payer: Self-pay | Admitting: *Deleted

## 2015-11-04 NOTE — Telephone Encounter (Signed)
  Oncology Nurse Navigator Documentation  Navigator Location: [facility] (11/03/15 1745) Navigator Encounter Type: Telephone (11/03/15 1745)             Treatment Phase: Other (11/03/15 1745) Barriers/Navigation Needs: Coordination of Care (11/03/15 1745)   Interventions: Coordination of Care (11/03/15 1745)   Coordination of Care: Hospice/Palliative Care (11/03/15 1745)       In follow-up to request by Burgess Estelle, [facility] Thoracic Nurse Navigator, I called Gavin Barnes to support him in his decision regarding additional treatment for recurrence of tongue cancer.  He shared with me his treatment history for initial diagnosis, risks and benefits of surgical option for recurrence, prognosis if no treatment.  I answered his questions regarding hospice care.  He indicated he would like to gather additional information about [facility], I indicated I would inform [name].  I provided my contact information should he wish to call me in the future.  He expressed appreciation for my call.  Addendum, 6/9:  Spoke with [name] this morning, noted patient wishes hospice information/follow-up.  Gayleen Orem, RN, BSN, [place] [facility] [place] [phone]              Time Spent with Patient: 30 (11/03/15 1745)

## 2015-11-14 ENCOUNTER — Inpatient Hospital Stay: Payer: BLUE CROSS/BLUE SHIELD | Attending: Oncology | Admitting: Oncology

## 2015-11-14 VITALS — BP 112/79 | HR 112 | Temp 98.5°F | Resp 18 | Wt 167.3 lb

## 2015-11-14 DIAGNOSIS — R630 Anorexia: Secondary | ICD-10-CM | POA: Insufficient documentation

## 2015-11-14 DIAGNOSIS — Z800 Family history of malignant neoplasm of digestive organs: Secondary | ICD-10-CM | POA: Diagnosis not present

## 2015-11-14 DIAGNOSIS — C029 Malignant neoplasm of tongue, unspecified: Secondary | ICD-10-CM

## 2015-11-14 DIAGNOSIS — L539 Erythematous condition, unspecified: Secondary | ICD-10-CM | POA: Diagnosis not present

## 2015-11-14 DIAGNOSIS — Z79899 Other long term (current) drug therapy: Secondary | ICD-10-CM | POA: Diagnosis not present

## 2015-11-14 DIAGNOSIS — I429 Cardiomyopathy, unspecified: Secondary | ICD-10-CM | POA: Diagnosis not present

## 2015-11-14 DIAGNOSIS — G479 Sleep disorder, unspecified: Secondary | ICD-10-CM | POA: Insufficient documentation

## 2015-11-14 DIAGNOSIS — F418 Other specified anxiety disorders: Secondary | ICD-10-CM | POA: Insufficient documentation

## 2015-11-14 DIAGNOSIS — R1310 Dysphagia, unspecified: Secondary | ICD-10-CM | POA: Insufficient documentation

## 2015-11-14 DIAGNOSIS — E785 Hyperlipidemia, unspecified: Secondary | ICD-10-CM | POA: Insufficient documentation

## 2015-11-14 DIAGNOSIS — C01 Malignant neoplasm of base of tongue: Secondary | ICD-10-CM | POA: Insufficient documentation

## 2015-11-14 DIAGNOSIS — C7989 Secondary malignant neoplasm of other specified sites: Secondary | ICD-10-CM | POA: Diagnosis not present

## 2015-11-14 DIAGNOSIS — I10 Essential (primary) hypertension: Secondary | ICD-10-CM | POA: Insufficient documentation

## 2015-11-14 MED ORDER — FENTANYL 25 MCG/HR TD PT72: 25.0000 ug | MEDICATED_PATCH | TRANSDERMAL | Status: DC

## 2015-11-14 NOTE — Progress Notes (Signed)
Patient does not take any tablets, he has a hard time swallowing he only takes Liquid oxycodone, thinks he may be having an issue with constipation. Asking if you could refill his oxycodone.
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
## 2015-11-21 ENCOUNTER — Other Ambulatory Visit: Payer: Self-pay | Admitting: *Deleted

## 2015-11-21 MED ORDER — OXYCODONE HCL 5 MG/5ML PO SOLN: 5.0000 mg | ORAL | Status: DC | PRN

## 2015-11-22 ENCOUNTER — Encounter: Payer: Self-pay | Admitting: Emergency Medicine

## 2015-11-22 ENCOUNTER — Telehealth: Payer: Self-pay | Admitting: *Deleted

## 2015-11-22 ENCOUNTER — Emergency Department
Admission: EM | Admit: 2015-11-22 | Discharge: 2015-11-23 | Disposition: A | Discharge status: Other Acute Inpatient Hospital | Payer: BLUE CROSS/BLUE SHIELD | Attending: Emergency Medicine | Admitting: Emergency Medicine

## 2015-11-22 DIAGNOSIS — R58 Hemorrhage, not elsewhere classified: Secondary | ICD-10-CM | POA: Diagnosis not present

## 2015-11-22 DIAGNOSIS — M1990 Unspecified osteoarthritis, unspecified site: Secondary | ICD-10-CM | POA: Insufficient documentation

## 2015-11-22 DIAGNOSIS — Z85810 Personal history of malignant neoplasm of tongue: Secondary | ICD-10-CM | POA: Insufficient documentation

## 2015-11-22 DIAGNOSIS — E785 Hyperlipidemia, unspecified: Secondary | ICD-10-CM | POA: Diagnosis not present

## 2015-11-22 DIAGNOSIS — X58XXXA Exposure to other specified factors, initial encounter: Secondary | ICD-10-CM | POA: Diagnosis not present

## 2015-11-22 DIAGNOSIS — I429 Cardiomyopathy, unspecified: Secondary | ICD-10-CM | POA: Insufficient documentation

## 2015-11-22 DIAGNOSIS — Z79899 Other long term (current) drug therapy: Secondary | ICD-10-CM | POA: Insufficient documentation

## 2015-11-22 DIAGNOSIS — I10 Essential (primary) hypertension: Secondary | ICD-10-CM | POA: Insufficient documentation

## 2015-11-22 DIAGNOSIS — Y999 Unspecified external cause status: Secondary | ICD-10-CM | POA: Insufficient documentation

## 2015-11-22 DIAGNOSIS — Y929 Unspecified place or not applicable: Secondary | ICD-10-CM | POA: Insufficient documentation

## 2015-11-22 DIAGNOSIS — S01512A Laceration without foreign body of oral cavity, initial encounter: Secondary | ICD-10-CM | POA: Insufficient documentation

## 2015-11-22 DIAGNOSIS — Z791 Long term (current) use of non-steroidal anti-inflammatories (NSAID): Secondary | ICD-10-CM | POA: Diagnosis not present

## 2015-11-22 DIAGNOSIS — K148 Other diseases of tongue: Secondary | ICD-10-CM

## 2015-11-22 DIAGNOSIS — Y939 Activity, unspecified: Secondary | ICD-10-CM | POA: Insufficient documentation

## 2015-11-22 LAB — BASIC METABOLIC PANEL
ANION GAP: 13 (ref 5–15)
BUN: 13 mg/dL (ref 6–20)
CALCIUM: 9.5 mg/dL (ref 8.9–10.3)
CHLORIDE: 98 mmol/L — AB (ref 101–111)
CO2: 25 mmol/L (ref 22–32)
CREATININE: 0.77 mg/dL (ref 0.61–1.24)
GFR calc Af Amer: >60 mL/min (ref >60)
GFR calc non Af Amer: >60 mL/min (ref >60)
GLUCOSE: 133 mg/dL — AB (ref 65–99)
Potassium: 3.8 mmol/L (ref 3.5–5.1)
Sodium: 136 mmol/L (ref 135–145)

## 2015-11-22 LAB — PROTIME-INR
INR: 1.4
Prothrombin Time: 17.3 seconds — ABNORMAL HIGH (ref 11.4–15.0)

## 2015-11-22 LAB — CBC
HEMATOCRIT: 36.1 % — AB (ref 40.0–52.0)
HEMOGLOBIN: 12.3 g/dL — AB (ref 13.0–18.0)
MCH: 30.2 pg (ref 26.0–34.0)
MCHC: 34 g/dL (ref 32.0–36.0)
MCV: 88.8 fL (ref 80.0–100.0)
Platelets: 254 10*3/uL (ref 150–440)
RBC: 4.07 MIL/uL — ABNORMAL LOW (ref 4.40–5.90)
RDW: 13.2 % (ref 11.5–14.5)
WBC: 11.8 10*3/uL — ABNORMAL HIGH (ref 3.8–10.6)

## 2015-11-22 MED ORDER — ONDANSETRON HCL 4 MG/2ML IJ SOLN
4.0000 mg | Freq: Once | INTRAMUSCULAR | Status: AC
  Administered 2015-11-22: 4 mg via INTRAVENOUS

## 2015-11-22 MED ORDER — MORPHINE SULFATE (PF) 4 MG/ML IV SOLN
4.0000 mg | Freq: Once | INTRAVENOUS | Status: AC
  Administered 2015-11-22: 4 mg via INTRAVENOUS

## 2015-11-22 MED ORDER — ONDANSETRON HCL 4 MG/2ML IJ SOLN
INTRAMUSCULAR | Status: AC
  Administered 2015-11-22: 4 mg via INTRAVENOUS
  Filled 2015-11-22: qty 2

## 2015-11-22 MED ORDER — MORPHINE SULFATE (PF) 4 MG/ML IV SOLN
INTRAVENOUS | Status: AC
  Administered 2015-11-22: 4 mg via INTRAVENOUS
  Filled 2015-11-22: qty 1
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
## 2015-11-22 NOTE — ED Notes (Addendum)
States he has tongue cancer and has a laceration to tongue  States this is the 3 rd time in 2 days

## 2015-11-22 NOTE — Telephone Encounter (Signed)
States for the past 24 hours his mouth keeps filling up with blood and he then spits blood for 5 - 10 minutes and it starts over again. He is supposed to leave tomorrow for [facility] and is asking if that is OK to still go. Per Dr Grayland Ormond, it is OK to go , but he needs to be aware the the bleeding is coming from his cancer and most likely will not stop.  When I called him back, he informed me that he is on his way to the ER to be checked out

## 2015-11-23 ENCOUNTER — Ambulatory Visit (HOSPITAL_COMMUNITY)
Admission: AD | Admit: 2015-11-23 | Discharge: 2015-11-23 | Disposition: A | Discharge status: Home / Self Care | Payer: BLUE CROSS/BLUE SHIELD | Source: Other Acute Inpatient Hospital | Attending: Emergency Medicine | Admitting: Emergency Medicine

## 2015-11-23 DIAGNOSIS — X58XXXA Exposure to other specified factors, initial encounter: Secondary | ICD-10-CM | POA: Diagnosis not present

## 2015-11-23 DIAGNOSIS — S01512A Laceration without foreign body of oral cavity, initial encounter: Secondary | ICD-10-CM | POA: Diagnosis present

## 2015-11-23 NOTE — ED Notes (Signed)
[facility] here; pt transferred to [facility]

## 2015-11-28 ENCOUNTER — Telehealth: Payer: Self-pay | Admitting: *Deleted

## 2015-11-28 DIAGNOSIS — C029 Malignant neoplasm of tongue, unspecified: Secondary | ICD-10-CM

## 2015-11-28 NOTE — Telephone Encounter (Signed)
Per Dr Grayland Ormond, Dana to double Fentanyl to 50 mcg. If he is hospice, do not agree with feeding tube, but if he wants treatment, have him come in to see MD to discuss treatment and feeding tube. Per Sonia Baller, he does not want treatment, but he wants feeding tube to improve nutrition and give him more time. She did not open him to hospice services due to this request and he continues to be a [facility] patient

## 2015-11-28 NOTE — Telephone Encounter (Signed)
Per Dr Grayland Ormond, he still thinks it is a bad idea for feeding tube, but agrees to refer him to surgery to discuss placement and the pros and cons of having it placed. Referral entered in computer.

## 2015-11-28 NOTE — Telephone Encounter (Signed)
Patient opened to services and stated he wants a feeding tube placed and would like for [place] to send a referral to get this done. Also reports pain is uncontrolled woth Fentanyl 25 mcg and is using Norco 5/325 every 4 hours, asking for increase in patch strength.

## 2015-12-01 ENCOUNTER — Telehealth: Payer: Self-pay | Admitting: *Deleted

## 2015-12-01 MED ORDER — LACTULOSE 10 GM/15ML PO SOLN: 10.0000 g | Freq: Two times a day (BID) | ORAL | Status: AC | PRN

## 2015-12-01 MED ORDER — FENTANYL 25 MCG/HR TD PT72: 75.0000 ug | MEDICATED_PATCH | TRANSDERMAL | Status: DC

## 2015-12-01 NOTE — Telephone Encounter (Signed)
Per Dr. Grayland Ormond, increase fentanyl to 44mcg. Pt instructed to wear 3 - 20mcg patches and to call back when needs refill. Z-Quil OTC for sleep, lactulose for constipation, OTC cough syrup for cough. Spoke with Lattie Haw with hospice who verbalized understanding.

## 2015-12-01 NOTE — Telephone Encounter (Signed)
Continues to have pain and would like to increase fentanyl patch. Currently wearing 2 fentanyl patches 23mcg for total dose of 12mcg. Pt currently is taking 5mg  oxycodone every 3 hours for break through pain. Requests medication to help with sleep, constipation, and cough. States has difficulty swallowing and requests meds be in liquid form, if possible.

## 2015-12-02 ENCOUNTER — Other Ambulatory Visit: Payer: Self-pay | Admitting: *Deleted

## 2015-12-02 ENCOUNTER — Telehealth: Payer: Self-pay

## 2015-12-02 ENCOUNTER — Telehealth: Payer: Self-pay | Admitting: *Deleted

## 2015-12-02 DIAGNOSIS — C029 Malignant neoplasm of tongue, unspecified: Secondary | ICD-10-CM

## 2015-12-02 MED ORDER — LORAZEPAM 2 MG/ML PO CONC: 2.0000 mg | Freq: Every day | ORAL | Status: DC

## 2015-12-02 MED ORDER — FENTANYL 75 MCG/HR TD PT72: 75.0000 ug | MEDICATED_PATCH | TRANSDERMAL | Status: DC

## 2015-12-02 NOTE — Telephone Encounter (Signed)
-----   Message from Mickie Kay sent at 11/30/2015  4:01 PM EDT ----- Regarding: referral I received a referral for this patient to be consulted on a PEG tube placement. He has tongue cancer with significant recurrence of disease and bilateral neck. Per the ED note on 11/22/15 he stated he only has 3-6 months of life. Would you consider this? And if so, when would you like to consult with the patient to discuss this with him?

## 2015-12-02 NOTE — Telephone Encounter (Signed)
Lisa from Hospice called in to request liquid Restoril for patient. I spoke with patients pharmacy and liquid Restoril is no longer available, pharmacy recommended liquid Ativan as an alternative. Prescriptions for Ativan and Fentanyl patches faxed to [facility]. Lattie Haw from [facility] aware.

## 2015-12-02 NOTE — Telephone Encounter (Signed)
Spoke with Gavin Barnes today regarding PEG tube placement. Gavin Barnes stated Dr. Grayland Ormond had stated he did not feel it was a good idea for Gavin Barnes to have this placed. Hospice care has been established with the Gavin Barnes. He wasn't sure if they would allow him to set that up now. I asked for his Hospice nurse, Lisa's number to contact her about this. Eliezer Lofts at [phone] and she stated the same thing as the patient. She stated he would be discharged from hospice if he gets the feeding tube. I advised her we would just not set this up at this time.

## 2015-12-06 ENCOUNTER — Telehealth: Payer: Self-pay

## 2015-12-06 ENCOUNTER — Other Ambulatory Visit: Payer: Self-pay | Admitting: *Deleted

## 2015-12-06 MED ORDER — OXYCODONE HCL 5 MG/5ML PO SOLN: 5.0000 mg | ORAL | Status: DC | PRN

## 2015-12-06 NOTE — Telephone Encounter (Signed)
Prescription faxed to patient's pharmacy. 

## 2015-12-06 NOTE — Telephone Encounter (Signed)
Patient needs refill on liquid oxycodone, it is prescribed to be taken Q4 hours but he is needing it Q2 hours.  Would also like a referral for a speech therapist.  Just needs a verbal OK.  Contacted Hospice nurse to state that it was ok to initiate referral

## 2015-12-13 ENCOUNTER — Telehealth: Payer: Self-pay | Admitting: *Deleted

## 2015-12-13 MED ORDER — MORPHINE SULFATE (CONCENTRATE) 20 MG/ML PO SOLN: ORAL | Status: DC

## 2015-12-13 MED ORDER — LIDOCAINE VISCOUS 2 % MT SOLN: 20.0000 mL | OROMUCOSAL | Status: DC | PRN

## 2015-12-13 MED ORDER — FENTANYL 100 MCG/HR TD PT72: 100.0000 ug | MEDICATED_PATCH | TRANSDERMAL | Status: AC

## 2015-12-13 NOTE — Telephone Encounter (Signed)
Pain not controlled with Fentanyl 75 mcg, he cannot swallow the Roxicodone 5 ml because it burns and he cannot swallow. Asking for Viscous Lidocaine, increase in fentanyl and Roxanol 0.25 ml - 1 ml q 1 h prn

## 2015-12-13 NOTE — Telephone Encounter (Signed)
Per Dr Grayland Ormond increase Fentanyl to 100 mcg, OK for Viscous lidocaine and Roxanol. Printed signed by MD and faxed to [facility]. [place] notified.

## 2015-12-15 ENCOUNTER — Telehealth: Payer: Self-pay | Admitting: *Deleted

## 2015-12-15 MED ORDER — SCOPOLAMINE 1 MG/3DAYS TD PT72: 1.0000 | MEDICATED_PATCH | TRANSDERMAL | Status: DC

## 2015-12-15 MED ORDER — LIDOCAINE VISCOUS 2 % MT SOLN: 20.0000 mL | OROMUCOSAL | Status: AC | PRN

## 2015-12-15 NOTE — Telephone Encounter (Signed)
Asking for scopolamine patch to help with secretions. And refill on lidocaine viscous

## 2015-12-15 NOTE — Telephone Encounter (Signed)
Crystal informed of rx being sent in

## 2015-12-19 ENCOUNTER — Telehealth: Payer: Self-pay | Admitting: *Deleted

## 2015-12-19 DIAGNOSIS — C029 Malignant neoplasm of tongue, unspecified: Secondary | ICD-10-CM

## 2015-12-19 MED ORDER — SCOPOLAMINE 1 MG/3DAYS TD PT72: MEDICATED_PATCH | TRANSDERMAL | 1 refills | Status: AC

## 2015-12-19 MED ORDER — MORPHINE SULFATE (CONCENTRATE) 20 MG/ML PO SOLN: ORAL | 0 refills | Status: AC

## 2015-12-19 MED ORDER — LORAZEPAM 2 MG/ML PO CONC: 2.0000 mg | Freq: Every day | ORAL | 0 refills | Status: AC

## 2015-12-19 NOTE — Telephone Encounter (Signed)
Yes to refills and scopolamine.

## 2015-12-19 NOTE — Telephone Encounter (Signed)
Still having pain and is now unable to swallow Oxycodone so he is using more Roxanol. Needs a refill on Roxanol and Lorazepam. Also is inquiring if scopolamine patch can be increased to wear 1 - 3 patches to help more with secretions

## 2016-01-27 DEATH — deceased

## 2016-03-23 ENCOUNTER — Ambulatory Visit: Payer: BLUE CROSS/BLUE SHIELD | Admitting: Radiation Oncology

## 2016-05-05 ENCOUNTER — Other Ambulatory Visit: Payer: Self-pay | Admitting: Nurse Practitioner

## 2017-03-27 IMAGING — PT NM PET TUM IMG RESTAG (PS) SKULL BASE T - THIGH
1 of 11 series · 1 of 25 positions shown · non-contrast
Comparison: 02/03/2015.

CLINICAL DATA: Subsequent treatment strategy for head and neck
cancer.

EXAM:
NUCLEAR MEDICINE PET SKULL BASE TO THIGH
TECHNIQUE: 12.9 mCi F-18 FDG was injected intravenously. Full-ring PET imaging
was performed from the skull base to thigh after the radiotracer. CT
data was obtained and used for attenuation correction and anatomic
localization.
FASTING BLOOD GLUCOSE:  Value: 91 mg/dl

[Series 3: ct wb 5.0 b30f · axial · 5.0mm · 0.98mm/px · 1 of 290 slices shown]
[im 290/290  brain]
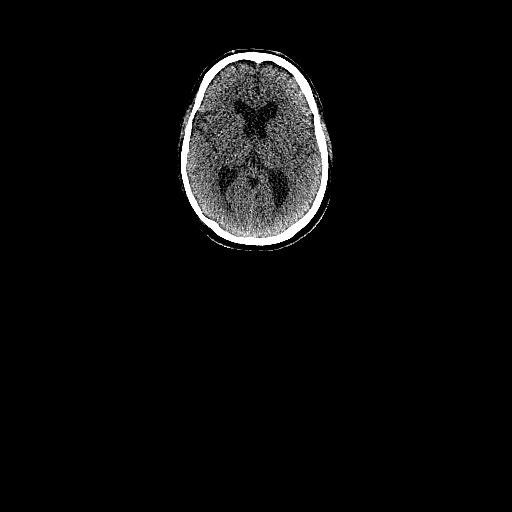

[1 of 25 positions shown; findings below may reference images not displayed]

FINDINGS: NECK

Small focus hypermetabolic FDG accumulation is identified in the
right mouth. This projects near the region of the palate, but is
probably related to the right tongue lesion slightly this registered
against CT data. SUV max = today is 6.6 compared to 10.5 previously.
The mildly hypermetabolic level IB and IIA lymph nodes seen on the
previous study have resolved in the interval with no residual
hypermetabolism above background soft tissue levels and no
measurable lymph nodes at these locations today.

CHEST

New patchy airspace disease is identified in the left lung apex
which mild hypermetabolism ( SUV max = 3.2). This may be related to
an infectious/ inflammatory alveolitis. If the left lung apex was
included in the radiation field, radiation changes would be a
consideration.

A new 3-4 mm pulmonary nodule is seen in the right upper lobe (image
85 series 3). This too small for reliable assessment on PET imaging.
A cluster of tiny nodules in the right middle lobe (image 104 series
3) is new in the interval. No hypermetabolism associated with this
area fashion/ inflammation is favored, potentially all infection.

ABDOMEN/PELVIS

No abnormal hypermetabolic activity within the liver, pancreas,
adrenal glands, or spleen. No hypermetabolic lymph nodes in the
abdomen or pelvis. The borderline enlarged inguinal lymph nodes seen
bilaterally on the previous study persists and are stable without
evidence for hypermetabolism today.

SKELETON

No hypermetabolic bone lesions.
IMPRESSION: 1. Persistent but decreased FDG accumulation in the region of the
right tongue.
2. Interval resolution of right cervical hypermetabolic
lymphadenopathy.
3. New mildly hypermetabolic patchy airspace disease in the left
apex. This may be infectious/inflammatory alveolitis or post
radiation change depending on the position of the radiation port.
4. New 3-4 mm posterior right upper lobe pulmonary nodule. Close
continued attention on follow-up recommended.
5. Clustered nodularity in the right middle lobe suggests atypical
infection.
